# Patient Record
Sex: Male | Born: 1970 | Race: White | Hispanic: No | Marital: Married | Smoking: Current every day smoker
Health system: Southern US, Community
[De-identification: ages and names within clinical notes are randomized; demographics above are authoritative.]

## PROBLEM LIST (undated history)

## (undated) DIAGNOSIS — F419 Anxiety disorder, unspecified: Secondary | ICD-10-CM

## (undated) DIAGNOSIS — E119 Type 2 diabetes mellitus without complications: Secondary | ICD-10-CM

## (undated) DIAGNOSIS — L299 Pruritus, unspecified: Secondary | ICD-10-CM

## (undated) DIAGNOSIS — I2510 Atherosclerotic heart disease of native coronary artery without angina pectoris: Secondary | ICD-10-CM

## (undated) HISTORY — DX: Pruritus, unspecified: L29.9

## (undated) HISTORY — DX: Anxiety disorder, unspecified: F41.9

---

## 2016-04-10 HISTORY — PX: CORONARY ARTERY BYPASS GRAFT: SHX141

## 2016-08-09 DIAGNOSIS — I2510 Atherosclerotic heart disease of native coronary artery without angina pectoris: Secondary | ICD-10-CM | POA: Diagnosis present

## 2016-08-12 DIAGNOSIS — E669 Obesity, unspecified: Secondary | ICD-10-CM | POA: Insufficient documentation

## 2016-08-12 DIAGNOSIS — IMO0002 Reserved for concepts with insufficient information to code with codable children: Secondary | ICD-10-CM

## 2016-08-12 DIAGNOSIS — Z91199 Patient's noncompliance with other medical treatment and regimen due to unspecified reason: Secondary | ICD-10-CM | POA: Insufficient documentation

## 2016-08-16 DIAGNOSIS — Z951 Presence of aortocoronary bypass graft: Secondary | ICD-10-CM | POA: Insufficient documentation

## 2016-08-22 DIAGNOSIS — I10 Essential (primary) hypertension: Secondary | ICD-10-CM | POA: Insufficient documentation

## 2016-08-22 DIAGNOSIS — K56609 Unspecified intestinal obstruction, unspecified as to partial versus complete obstruction: Secondary | ICD-10-CM | POA: Insufficient documentation

## 2016-08-23 DIAGNOSIS — I255 Ischemic cardiomyopathy: Secondary | ICD-10-CM | POA: Insufficient documentation

## 2016-08-23 DIAGNOSIS — R778 Other specified abnormalities of plasma proteins: Secondary | ICD-10-CM | POA: Insufficient documentation

## 2016-08-25 DIAGNOSIS — R739 Hyperglycemia, unspecified: Secondary | ICD-10-CM | POA: Insufficient documentation

## 2016-08-25 DIAGNOSIS — D7589 Other specified diseases of blood and blood-forming organs: Secondary | ICD-10-CM | POA: Insufficient documentation

## 2016-08-28 DIAGNOSIS — E43 Unspecified severe protein-calorie malnutrition: Secondary | ICD-10-CM | POA: Insufficient documentation

## 2019-01-09 ENCOUNTER — Other Ambulatory Visit: Payer: Self-pay

## 2019-01-09 DIAGNOSIS — Z20822 Contact with and (suspected) exposure to covid-19: Secondary | ICD-10-CM

## 2019-01-11 LAB — NOVEL CORONAVIRUS, NAA: SARS-CoV-2, NAA: NOT DETECTED

## 2019-11-12 DIAGNOSIS — R0789 Other chest pain: Secondary | ICD-10-CM | POA: Insufficient documentation

## 2020-02-02 DIAGNOSIS — M4802 Spinal stenosis, cervical region: Secondary | ICD-10-CM | POA: Insufficient documentation

## 2020-02-02 DIAGNOSIS — Z6828 Body mass index (BMI) 28.0-28.9, adult: Secondary | ICD-10-CM | POA: Insufficient documentation

## 2020-02-23 DIAGNOSIS — G5620 Lesion of ulnar nerve, unspecified upper limb: Secondary | ICD-10-CM | POA: Insufficient documentation

## 2021-04-15 ENCOUNTER — Emergency Department (HOSPITAL_COMMUNITY): Payer: Commercial Managed Care - PPO

## 2021-04-15 ENCOUNTER — Emergency Department (HOSPITAL_COMMUNITY)
Admission: EM | Admit: 2021-04-15 | Discharge: 2021-04-15 | Disposition: A | Discharge status: Home / Self Care | Payer: Commercial Managed Care - PPO | Source: Home / Self Care

## 2021-04-15 ENCOUNTER — Other Ambulatory Visit: Payer: Self-pay

## 2021-04-15 ENCOUNTER — Encounter (HOSPITAL_COMMUNITY): Payer: Self-pay | Admitting: Emergency Medicine

## 2021-04-15 DIAGNOSIS — Z5321 Procedure and treatment not carried out due to patient leaving prior to being seen by health care provider: Secondary | ICD-10-CM | POA: Insufficient documentation

## 2021-04-15 DIAGNOSIS — I6350 Cerebral infarction due to unspecified occlusion or stenosis of unspecified cerebral artery: Secondary | ICD-10-CM | POA: Diagnosis not present

## 2021-04-15 DIAGNOSIS — Y900 Blood alcohol level of less than 20 mg/100 ml: Secondary | ICD-10-CM | POA: Insufficient documentation

## 2021-04-15 DIAGNOSIS — I639 Cerebral infarction, unspecified: Secondary | ICD-10-CM | POA: Insufficient documentation

## 2021-04-15 DIAGNOSIS — H538 Other visual disturbances: Secondary | ICD-10-CM | POA: Insufficient documentation

## 2021-04-15 DIAGNOSIS — H5461 Unqualified visual loss, right eye, normal vision left eye: Secondary | ICD-10-CM | POA: Diagnosis not present

## 2021-04-15 HISTORY — DX: Atherosclerotic heart disease of native coronary artery without angina pectoris: I25.10

## 2021-04-15 HISTORY — DX: Type 2 diabetes mellitus without complications: E11.9

## 2021-04-15 LAB — CBC
HCT: 51.4 % (ref 39.0–52.0)
Hemoglobin: 17.3 g/dL — ABNORMAL HIGH (ref 13.0–17.0)
MCH: 33 pg (ref 26.0–34.0)
MCHC: 33.7 g/dL (ref 30.0–36.0)
MCV: 97.9 fL (ref 80.0–100.0)
Platelets: 195 10*3/uL (ref 150–400)
RBC: 5.25 MIL/uL (ref 4.22–5.81)
RDW: 12.3 % (ref 11.5–15.5)
WBC: 10.3 10*3/uL (ref 4.0–10.5)
nRBC: 0 % (ref 0.0–0.2)

## 2021-04-15 LAB — COMPREHENSIVE METABOLIC PANEL
ALT: 30 U/L (ref 0–44)
AST: 32 U/L (ref 15–41)
Albumin: 4.1 g/dL (ref 3.5–5.0)
Alkaline Phosphatase: 96 U/L (ref 38–126)
Anion gap: 8 (ref 5–15)
BUN: 10 mg/dL (ref 6–20)
CO2: 25 mmol/L (ref 22–32)
Calcium: 9.3 mg/dL (ref 8.9–10.3)
Chloride: 103 mmol/L (ref 98–111)
Creatinine, Ser: 0.76 mg/dL (ref 0.61–1.24)
GFR, Estimated: >60 mL/min (ref >60)
Glucose, Bld: 119 mg/dL — ABNORMAL HIGH (ref 70–99)
Potassium: 4.6 mmol/L (ref 3.5–5.1)
Sodium: 136 mmol/L (ref 135–145)
Total Bilirubin: 0.9 mg/dL (ref 0.3–1.2)
Total Protein: 7.4 g/dL (ref 6.5–8.1)

## 2021-04-15 LAB — I-STAT CHEM 8, ED
BUN: 12 mg/dL (ref 6–20)
Calcium, Ion: 1.19 mmol/L (ref 1.15–1.40)
Chloride: 105 mmol/L (ref 98–111)
Creatinine, Ser: 0.7 mg/dL (ref 0.61–1.24)
Glucose, Bld: 114 mg/dL — ABNORMAL HIGH (ref 70–99)
HCT: 52 % (ref 39.0–52.0)
Hemoglobin: 17.7 g/dL — ABNORMAL HIGH (ref 13.0–17.0)
Potassium: 4.4 mmol/L (ref 3.5–5.1)
Sodium: 139 mmol/L (ref 135–145)
TCO2: 27 mmol/L (ref 22–32)

## 2021-04-15 LAB — DIFFERENTIAL
Abs Immature Granulocytes: 0.03 10*3/uL (ref 0.00–0.07)
Basophils Absolute: 0 10*3/uL (ref 0.0–0.1)
Basophils Relative: 0 %
Eosinophils Absolute: 0.1 10*3/uL (ref 0.0–0.5)
Eosinophils Relative: 1 %
Immature Granulocytes: 0 %
Lymphocytes Relative: 33 %
Lymphs Abs: 3.4 10*3/uL (ref 0.7–4.0)
Monocytes Absolute: 0.9 10*3/uL (ref 0.1–1.0)
Monocytes Relative: 9 %
Neutro Abs: 5.8 10*3/uL (ref 1.7–7.7)
Neutrophils Relative %: 57 %

## 2021-04-15 LAB — ETHANOL: Alcohol, Ethyl (B): <10 mg/dL (ref <10)

## 2021-04-15 LAB — APTT: aPTT: 31 seconds (ref 24–36)

## 2021-04-15 LAB — PROTIME-INR
INR: 1 (ref 0.8–1.2)
Prothrombin Time: 12.7 seconds (ref 11.4–15.2)

## 2021-04-15 IMAGING — CT CT HEAD W/O CM
4 series · 16 of 47 positions shown, 18 images · non-contrast
Comparison: None.

CLINICAL DATA: Neuro deficit, acute stroke suspected. Reported to
be unable to see out of his left eye upon awakening today.

EXAM:
CT HEAD WITHOUT CONTRAST
TECHNIQUE: Contiguous axial images were obtained from the base of the skull
through the vertex without intravenous contrast.

[Series 3: head without · axial · non-contrast · 0.46mm/px · z∈[-123,+7]mm · 7 of 36 slices shown, 9 images]
[im 5/36  brain]
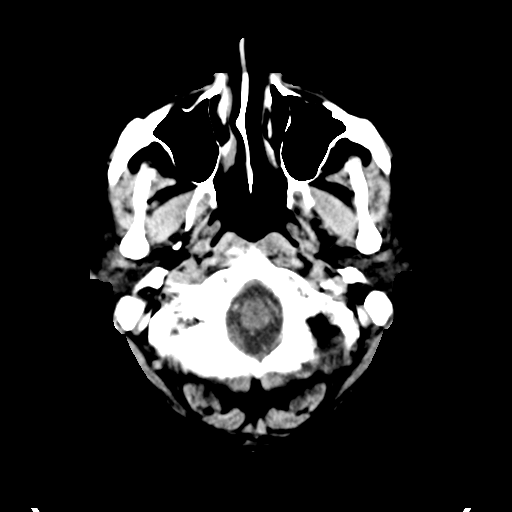
[im 5/36  bone]
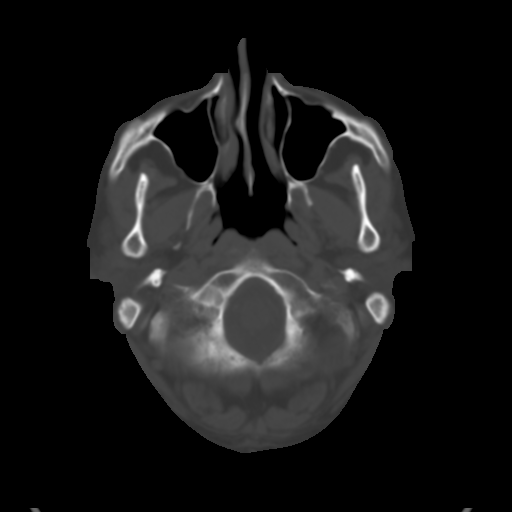
[im 9/36  brain]
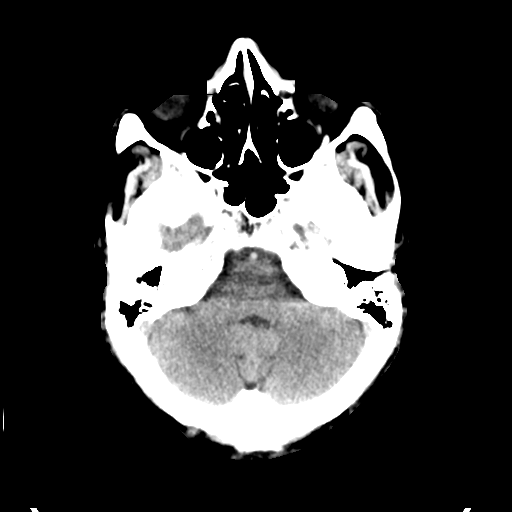
[im 14/36  brain]
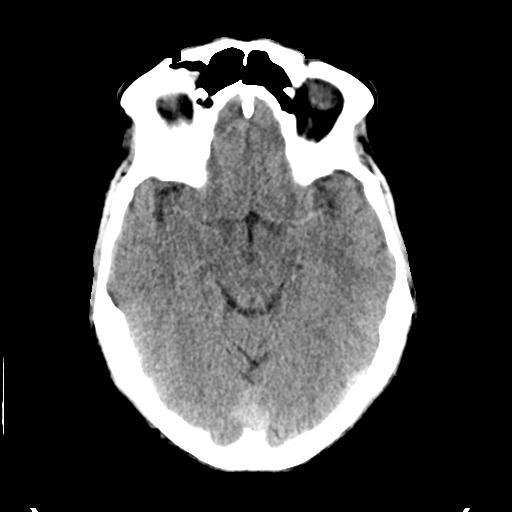
[im 18/36  brain]
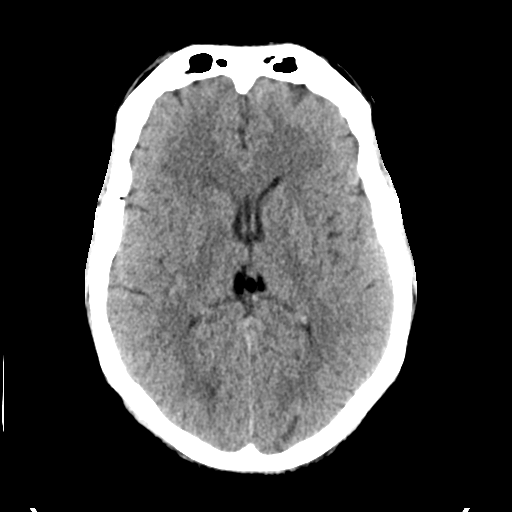
[im 22/36  brain]
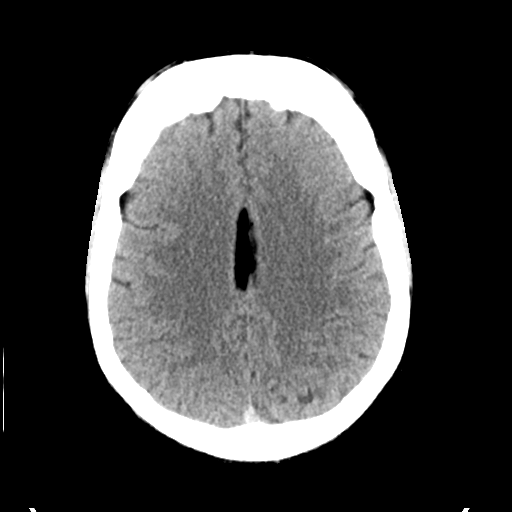
[im 22/36  bone]
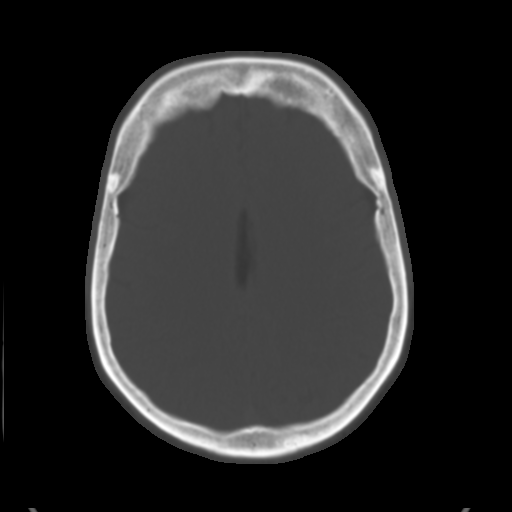
[im 27/36  brain]
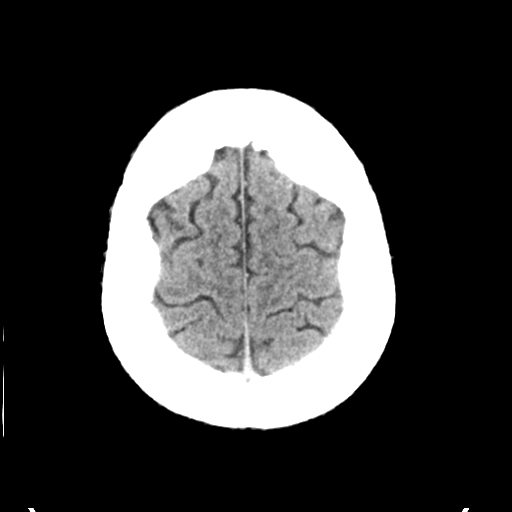
[im 31/36  brain]
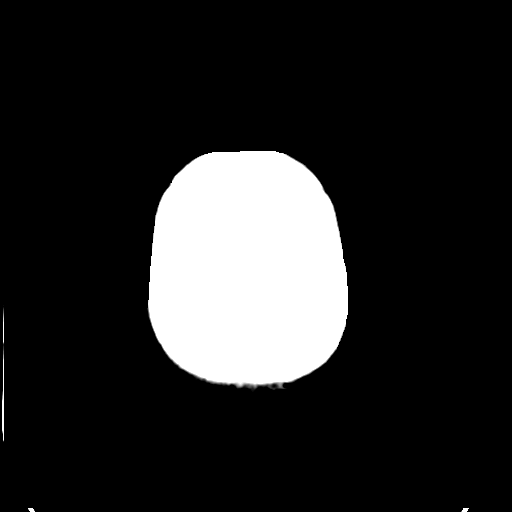

[Series 4: head bone · axial · 0.46mm/px · z∈[-127,-91]mm · 3 of 88 slices shown]
[im 9/88  bone]
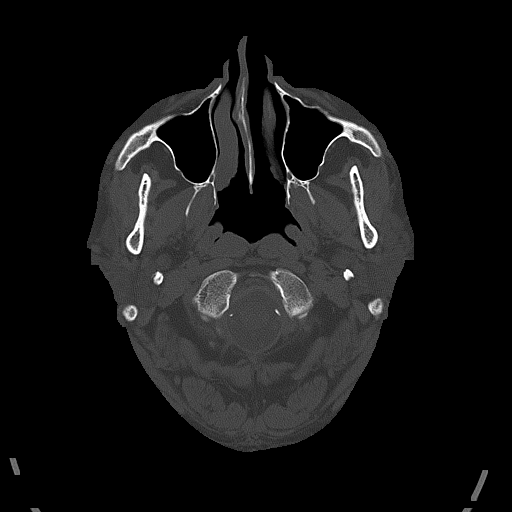
[im 18/88  bone]
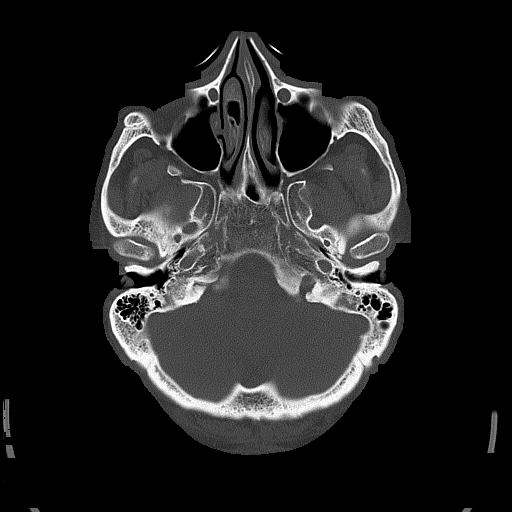
[im 27/88  bone]
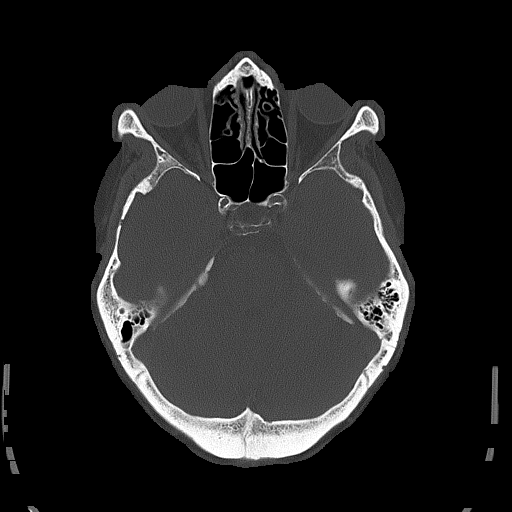

[Series 5: head without cor · coronal · non-contrast · 0.38mm/px · 3 of 76 slices shown]
[im 26/76  brain]
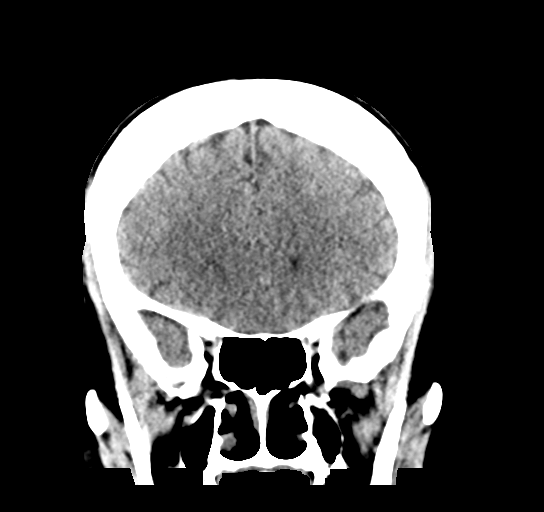
[im 34/76  brain]
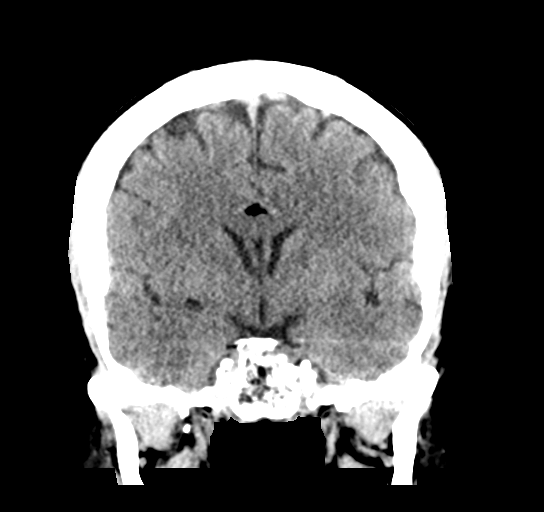
[im 42/76  brain]
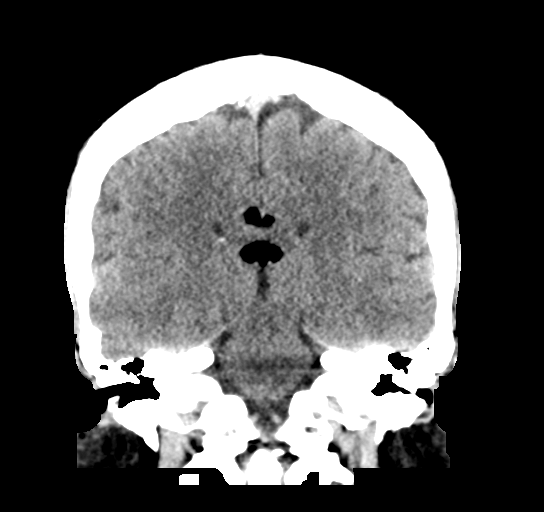

[Series 6: head without sag · sagittal · non-contrast · 0.40mm/px · 3 of 67 slices shown]
[im 23/67  brain]
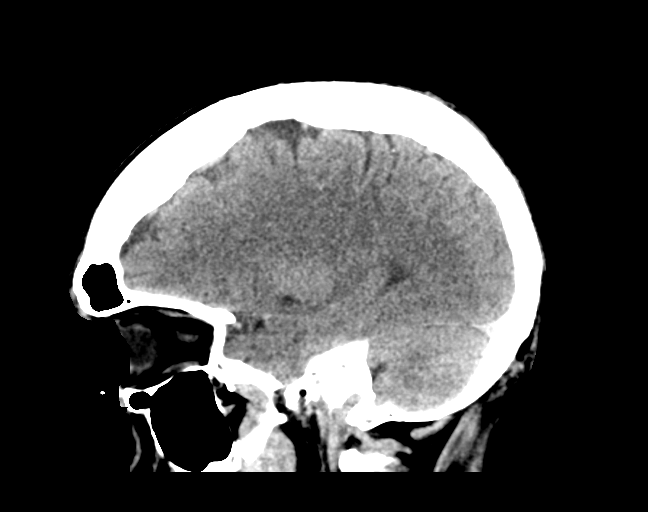
[im 34/67  brain]
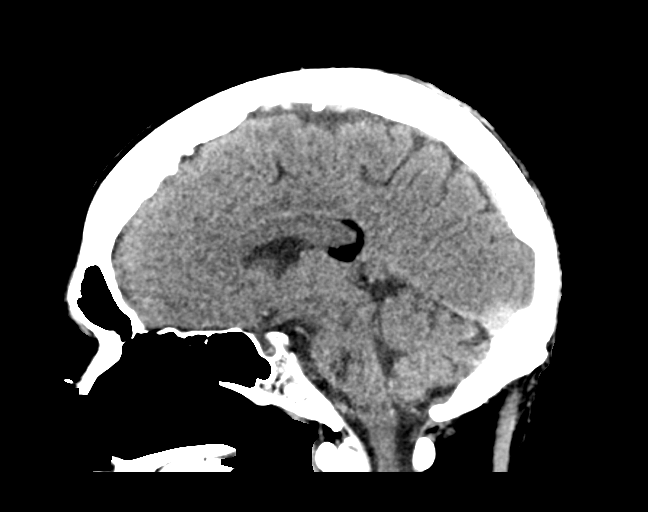
[im 45/67  brain]
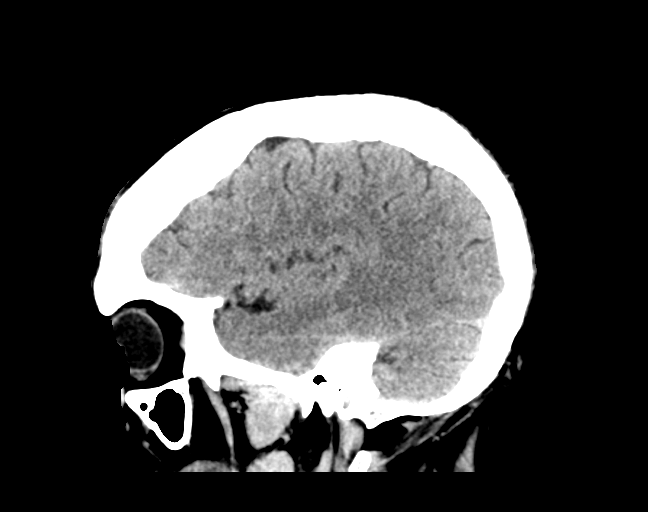

[16 of 47 positions shown; findings below may reference images not displayed]

FINDINGS: Brain: There is no evidence of acute intracranial hemorrhage, mass
lesion, brain edema or extra-axial fluid collection. The ventricles
and subarachnoid spaces are appropriately sized for age. There is no
CT evidence of acute cortical infarction. There is a large
curvilinear pericallosal lipoma, measuring up to 5.4 cm in length on
image [DATE].

Vascular: Intracranial vascular calcifications. No hyperdense vessel
identified.

Skull: Negative for fracture or focal lesion.

Sinuses/Orbits: Probable small mucous retention cyst inferiorly in
the left maxillary sinus. The additional visualized paranasal
sinuses, mastoid air cells and middle ears are clear.

Other: None.
IMPRESSION: 1. No acute intracranial findings demonstrated. No CT evidence of
acute stroke or hemorrhage.
2. Incidental large curvilinear pericallosal lipoma.

## 2021-04-15 NOTE — ED Triage Notes (Addendum)
Pt. Stated, when I woke up 0400 , my vision was not right , it was blurry. I put my hand over left eye the right eye was nothing black. I went to a eye Dr. In [place] sent me to a Dr. In [place] and the Dr. Here sent me to ER. The Dr. In [place] said he a plague on his eye. Everything was normal when he went to sleep No arm drift, symmetrical smile, Alert and oriented x 4.

## 2021-04-15 NOTE — ED Notes (Signed)
Patient not yet roomed from triage.

## 2021-04-15 NOTE — ED Provider Triage Note (Signed)
Emergency Medicine Provider Triage Evaluation Note  Martin Lee , a 51 y.o. male  was evaluated in triage.  Pt complains of right-sided vision loss.  She states that he went to bed around 6 PM last night with normal vision and woke up this morning with right-sided vision loss.  He states he does not complete vision loss however he has had decreased vision on that side.  He denies headache, fevers, painful vision loss, painful extraocular movements, nausea or vomiting, head or eye trauma.  Denies numbness or tingling in his extremities or other neurological signs just weakness, facial droop, slurred speech.  Review of Systems  Positive: See above Negative:   Physical Exam  BP (!) 139/99    Pulse 96    Temp 97.9 F (36.6 C) (Oral)    Resp 16    SpO2 98%  Gen:   Awake, no distress   Resp:  Normal effort  MSK:   Moves extremities without difficulty  Other:  Peripheral field deficit on the right eye.  PERRLA.  Extraocular movements without pain.  Medical Decision Making  Medically screening exam initiated at 12:22 PM.  Appropriate orders placed.  Martin Lee was informed that the remainder of the evaluation will be completed by another provider, this initial triage assessment does not replace that evaluation, and the importance of remaining in the ED until their evaluation is complete.  Spoke with Dr. Warden Fillers who saw patient this morning. 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     Mickie Hillier, PA-C 04/15/21 1228

## 2021-04-15 NOTE — ED Notes (Signed)
Called loudly x3 and really loudly x1 in lobby. No response.

## 2021-04-15 NOTE — ED Provider Notes (Signed)
Spoke with Dr. Quinn Axe, neurology who agrees that patient needs to have continued stroke work-up.  She states that she would like him admitted through hospitalist and she will place further imaging orders at this time.  We will try and place patient in ER bed from waiting room so he can be evaluated by neurology and admitted.   Mickie Hillier, PA-C 04/15/21 1554    Horton, Alvin Critchley, DO 04/18/21 1454

## 2021-04-15 NOTE — ED Provider Notes (Signed)
Dr. Sallye Lat, ophthalmology came to ED to re-evaluate the patient and agrees with this providers visual assessment. Per MD, patient had complete vision loss in R eye this morning. He does have some return of vision to the right eye with peripheral field deficits. He appreciates neuro input on further imaging CTA vs MRA etc.    Cristopher Peru, PA-C 04/15/21 1325    Horton, Clabe Seal, DO 04/18/21 1454

## 2021-04-15 NOTE — ED Provider Notes (Signed)
Patient called from the lobby by nursing to be seen in her room to be evaluated by neurology.  He was called multiple times with no answer.  I have phoned him this his cell.  He states that he drove home back to [place] due to a family emergency.  I discussed that neurology wanted him to be evaluated and admitted to the hospital for further work-up of his visual changes.  Discussed concerns for possible stroke.  He verbalized understanding.  I discussed that he should return as soon as possible.  He verbalized understanding.   Cristopher Peru, PA-C 04/15/21 1642    Jacalyn Lefevre, MD 04/15/21 [place] Guardian

## 2021-04-16 ENCOUNTER — Encounter (HOSPITAL_COMMUNITY): Payer: Self-pay

## 2021-04-16 ENCOUNTER — Emergency Department (HOSPITAL_COMMUNITY): Payer: Commercial Managed Care - PPO

## 2021-04-16 ENCOUNTER — Inpatient Hospital Stay (HOSPITAL_COMMUNITY): Payer: Commercial Managed Care - PPO

## 2021-04-16 ENCOUNTER — Inpatient Hospital Stay (HOSPITAL_COMMUNITY)
Admission: EM | Admit: 2021-04-16 | Discharge: 2021-04-20 | DRG: 038 | Disposition: A | Discharge status: Home / Self Care | Payer: Commercial Managed Care - PPO | Source: Ambulatory Visit | Attending: Internal Medicine | Admitting: Internal Medicine

## 2021-04-16 ENCOUNTER — Observation Stay (HOSPITAL_COMMUNITY): Payer: Commercial Managed Care - PPO

## 2021-04-16 DIAGNOSIS — E1159 Type 2 diabetes mellitus with other circulatory complications: Secondary | ICD-10-CM | POA: Diagnosis not present

## 2021-04-16 DIAGNOSIS — D72829 Elevated white blood cell count, unspecified: Secondary | ICD-10-CM | POA: Diagnosis present

## 2021-04-16 DIAGNOSIS — Z20822 Contact with and (suspected) exposure to covid-19: Secondary | ICD-10-CM | POA: Diagnosis present

## 2021-04-16 DIAGNOSIS — I6389 Other cerebral infarction: Secondary | ICD-10-CM | POA: Diagnosis not present

## 2021-04-16 DIAGNOSIS — I10 Essential (primary) hypertension: Secondary | ICD-10-CM | POA: Diagnosis present

## 2021-04-16 DIAGNOSIS — I6350 Cerebral infarction due to unspecified occlusion or stenosis of unspecified cerebral artery: Secondary | ICD-10-CM | POA: Diagnosis present

## 2021-04-16 DIAGNOSIS — I63031 Cerebral infarction due to thrombosis of right carotid artery: Secondary | ICD-10-CM | POA: Diagnosis not present

## 2021-04-16 DIAGNOSIS — F32A Depression, unspecified: Secondary | ICD-10-CM | POA: Diagnosis present

## 2021-04-16 DIAGNOSIS — D751 Secondary polycythemia: Secondary | ICD-10-CM | POA: Diagnosis present

## 2021-04-16 DIAGNOSIS — E119 Type 2 diabetes mellitus without complications: Secondary | ICD-10-CM | POA: Diagnosis present

## 2021-04-16 DIAGNOSIS — H5461 Unqualified visual loss, right eye, normal vision left eye: Secondary | ICD-10-CM | POA: Diagnosis present

## 2021-04-16 DIAGNOSIS — I6521 Occlusion and stenosis of right carotid artery: Secondary | ICD-10-CM | POA: Diagnosis present

## 2021-04-16 DIAGNOSIS — I25700 Atherosclerosis of coronary artery bypass graft(s), unspecified, with unstable angina pectoris: Secondary | ICD-10-CM | POA: Diagnosis not present

## 2021-04-16 DIAGNOSIS — I2510 Atherosclerotic heart disease of native coronary artery without angina pectoris: Secondary | ICD-10-CM | POA: Diagnosis present

## 2021-04-16 DIAGNOSIS — I6310 Cerebral infarction due to embolism of unspecified precerebral artery: Secondary | ICD-10-CM

## 2021-04-16 DIAGNOSIS — Z720 Tobacco use: Secondary | ICD-10-CM | POA: Diagnosis present

## 2021-04-16 DIAGNOSIS — R29702 NIHSS score 2: Secondary | ICD-10-CM | POA: Diagnosis present

## 2021-04-16 DIAGNOSIS — H53131 Sudden visual loss, right eye: Secondary | ICD-10-CM

## 2021-04-16 DIAGNOSIS — I25810 Atherosclerosis of coronary artery bypass graft(s) without angina pectoris: Secondary | ICD-10-CM | POA: Diagnosis not present

## 2021-04-16 DIAGNOSIS — Z8673 Personal history of transient ischemic attack (TIA), and cerebral infarction without residual deficits: Secondary | ICD-10-CM

## 2021-04-16 DIAGNOSIS — E1149 Type 2 diabetes mellitus with other diabetic neurological complication: Secondary | ICD-10-CM

## 2021-04-16 DIAGNOSIS — IMO0002 Reserved for concepts with insufficient information to code with codable children: Secondary | ICD-10-CM

## 2021-04-16 DIAGNOSIS — Z951 Presence of aortocoronary bypass graft: Secondary | ICD-10-CM | POA: Diagnosis not present

## 2021-04-16 DIAGNOSIS — Z79899 Other long term (current) drug therapy: Secondary | ICD-10-CM

## 2021-04-16 DIAGNOSIS — I63231 Cerebral infarction due to unspecified occlusion or stenosis of right carotid arteries: Secondary | ICD-10-CM | POA: Diagnosis not present

## 2021-04-16 DIAGNOSIS — F17210 Nicotine dependence, cigarettes, uncomplicated: Secondary | ICD-10-CM | POA: Diagnosis present

## 2021-04-16 DIAGNOSIS — F419 Anxiety disorder, unspecified: Secondary | ICD-10-CM | POA: Diagnosis present

## 2021-04-16 DIAGNOSIS — I639 Cerebral infarction, unspecified: Secondary | ICD-10-CM | POA: Diagnosis present

## 2021-04-16 DIAGNOSIS — E1169 Type 2 diabetes mellitus with other specified complication: Secondary | ICD-10-CM

## 2021-04-16 DIAGNOSIS — I63239 Cerebral infarction due to unspecified occlusion or stenosis of unspecified carotid arteries: Secondary | ICD-10-CM | POA: Diagnosis not present

## 2021-04-16 DIAGNOSIS — Z7982 Long term (current) use of aspirin: Secondary | ICD-10-CM | POA: Diagnosis not present

## 2021-04-16 DIAGNOSIS — H3411 Central retinal artery occlusion, right eye: Secondary | ICD-10-CM | POA: Diagnosis present

## 2021-04-16 DIAGNOSIS — H539 Unspecified visual disturbance: Secondary | ICD-10-CM | POA: Diagnosis not present

## 2021-04-16 DIAGNOSIS — E782 Mixed hyperlipidemia: Secondary | ICD-10-CM | POA: Diagnosis present

## 2021-04-16 LAB — BASIC METABOLIC PANEL
Anion gap: 9 (ref 5–15)
BUN: 11 mg/dL (ref 6–20)
CO2: 23 mmol/L (ref 22–32)
Calcium: 9.5 mg/dL (ref 8.9–10.3)
Chloride: 106 mmol/L (ref 98–111)
Creatinine, Ser: 0.78 mg/dL (ref 0.61–1.24)
GFR, Estimated: >60 mL/min (ref >60)
Glucose, Bld: 139 mg/dL — ABNORMAL HIGH (ref 70–99)
Potassium: 5 mmol/L (ref 3.5–5.1)
Sodium: 138 mmol/L (ref 135–145)

## 2021-04-16 LAB — ECHOCARDIOGRAM COMPLETE BUBBLE STUDY
AR max vel: 1.74 cm2
AV Area VTI: 1.9 cm2
AV Area mean vel: 1.66 cm2
AV Mean grad: 3 mmHg
AV Peak grad: 5.5 mmHg
Ao pk vel: 1.17 m/s
Area-P 1/2: 4.8 cm2
S' Lateral: 4 cm

## 2021-04-16 LAB — CBC
HCT: 51 % (ref 39.0–52.0)
Hemoglobin: 17.8 g/dL — ABNORMAL HIGH (ref 13.0–17.0)
MCH: 33.6 pg (ref 26.0–34.0)
MCHC: 34.9 g/dL (ref 30.0–36.0)
MCV: 96.2 fL (ref 80.0–100.0)
Platelets: 194 10*3/uL (ref 150–400)
RBC: 5.3 MIL/uL (ref 4.22–5.81)
RDW: 12.3 % (ref 11.5–15.5)
WBC: 10.6 10*3/uL — ABNORMAL HIGH (ref 4.0–10.5)
nRBC: 0.2 % (ref 0.0–0.2)

## 2021-04-16 LAB — RESP PANEL BY RT-PCR (FLU A&B, COVID) ARPGX2
Influenza A by PCR: NEGATIVE
Influenza B by PCR: NEGATIVE
SARS Coronavirus 2 by RT PCR: NEGATIVE

## 2021-04-16 LAB — HEMOGLOBIN A1C
Hgb A1c MFr Bld: 7.4 % — ABNORMAL HIGH (ref 4.8–5.6)
Mean Plasma Glucose: 165.68 mg/dL

## 2021-04-16 LAB — LDL CHOLESTEROL, DIRECT: Direct LDL: 76.5 mg/dL (ref 0–99)

## 2021-04-16 IMAGING — CT CT ANGIO HEAD-NECK (W OR W/O PERF)
1 of 11 series · 5 of 33 positions shown · IV contrast (APPLIED)
Comparison: Brain MRI/MRA [DATE]

CLINICAL DATA: Acute neurologic deficit.

EXAM:
CT ANGIOGRAPHY HEAD AND NECK
TECHNIQUE: Multidetector CT imaging of the head and neck was performed using
the standard protocol during bolus administration of intravenous
contrast. Multiplanar CT image reconstructions and MIPs were
obtained to evaluate the vascular anatomy. Carotid stenosis
measurements (when applicable) are obtained utilizing NASCET
criteria, using the distal internal carotid diameter as the
denominator.
CONTRAST:  75mL OMNIPAQUE IOHEXOL 350 MG/ML SOLN

[Series 11: ax thins · axial · 0.46mm/px · z∈[-282,-36]mm · 5 of 371 slices shown]
[im 62/371  soft-tissue]
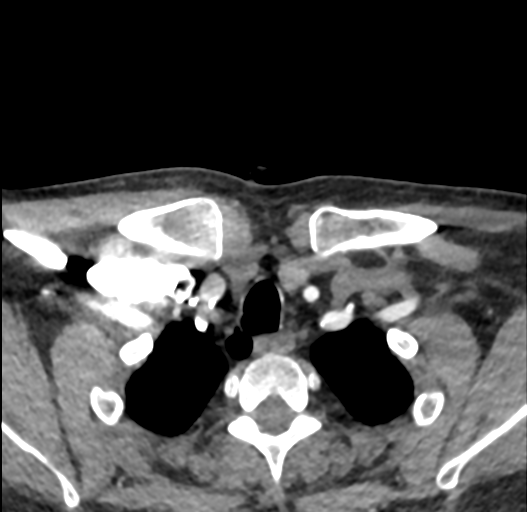
[im 124/371  bone]
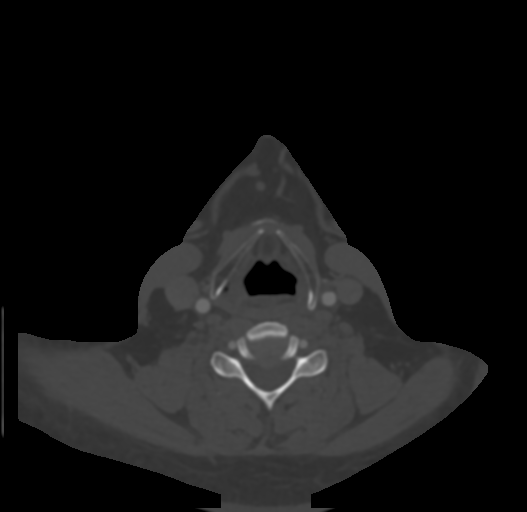
[im 186/371  soft-tissue]
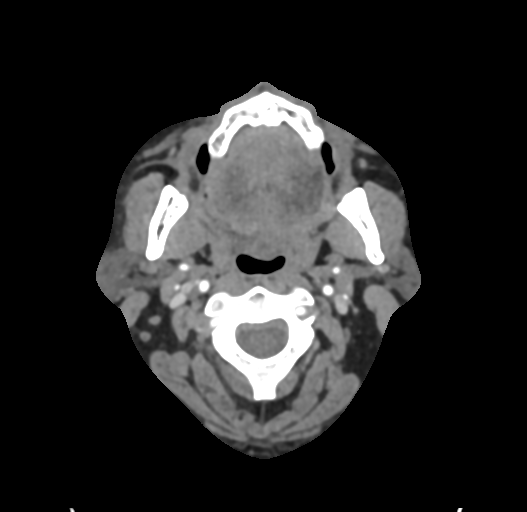
[im 247/371  bone]
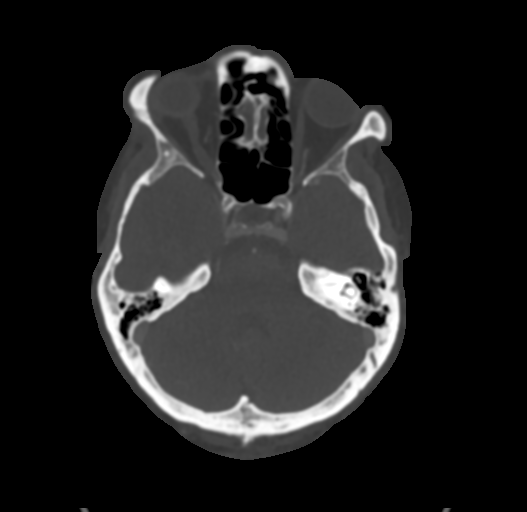
[im 309/371  soft-tissue]
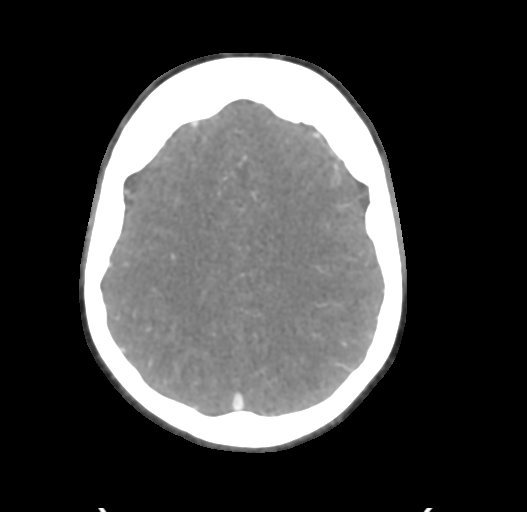

[5 of 33 positions shown; findings below may reference images not displayed]

FINDINGS: CT HEAD FINDINGS

Brain: No hemorrhage or extra-axial collection. Pericallosal lipoma
again noted. The size and configuration of the ventricles and
extra-axial CSF spaces are normal. There is no acute or chronic
infarction. The brain parenchyma is normal.

Skull: The visualized skull base, calvarium and extracranial soft
tissues are normal.

Sinuses/Orbits: No fluid levels or advanced mucosal thickening of
the visualized paranasal sinuses. No mastoid or middle ear effusion.
The orbits are normal.

CTA NECK FINDINGS

SKELETON: There is no bony spinal canal stenosis. No lytic or
blastic lesion.

OTHER NECK: Normal pharynx, larynx and major salivary glands. No
cervical lymphadenopathy. Unremarkable thyroid gland.

UPPER CHEST: No pneumothorax or pleural effusion. No nodules or
masses.

AORTIC ARCH:

There is calcific atherosclerosis of the aortic arch. There is no
aneurysm, dissection or hemodynamically significant stenosis of the
visualized portion of the aorta. Conventional 3 vessel aortic
branching pattern. The visualized proximal subclavian arteries are
widely patent.

RIGHT CAROTID SYSTEM: No dissection, occlusion or aneurysm. There is
predominantly low density atherosclerosis extending into the
proximal ICA, resulting in 50% stenosis.

LEFT CAROTID SYSTEM: No dissection, occlusion or aneurysm. There is
predominantly low density atherosclerosis extending into the
proximal ICA, resulting in less than 50% stenosis.

VERTEBRAL ARTERIES: Codominant configuration. Both origins are
clearly patent. There is no dissection, occlusion or flow-limiting
stenosis to the skull base (V1-V3 segments).

CTA HEAD FINDINGS

POSTERIOR CIRCULATION:

--Vertebral arteries: Bilateral atherosclerotic calcification with
moderate stenosis, but both remain patent.

--Inferior cerebellar arteries: Normal.

--Basilar artery: Normal.

--Superior cerebellar arteries: Normal.

--Posterior cerebral arteries (PCA): Normal.

ANTERIOR CIRCULATION:

--Intracranial internal carotid arteries: Normal.

--Anterior cerebral arteries (ACA): Normal. Both A1 segments are
present. Patent anterior communicating artery (a-comm).

--Middle cerebral arteries (MCA): Normal.

VENOUS SINUSES: As permitted by contrast timing, patent.

ANATOMIC VARIANTS: None

Review of the MIP images confirms the above findings.
IMPRESSION: 1. No emergent large vessel occlusion.
2. Bilateral proximal ICA plaque causing 50% stenosis on the right
and no hemodynamically significant stenosis on the left.
3. Moderate bilateral vertebral artery V4 segment atherosclerotic
calcification with moderate stenosis, but maintained patency.
4. Pericallosal lipoma, unchanged.

Aortic atherosclerosis ([Z4]-[Z4]).

## 2021-04-16 IMAGING — MR MR HEAD W/O CM
5 of 10 series · 23 of 48 positions shown · non-contrast
Comparison: Same day CT head.

CLINICAL DATA: Neuro deficit, acute, stroke suspected

EXAM:
MRI HEAD WITHOUT CONTRAST
TECHNIQUE: Multiplanar, multiecho pulse sequences of the brain and surrounding
structures were obtained without intravenous contrast.

[Series 2: DWI · axial · 3.0mm · 0.94mm/px · z∈[-73,+79]mm · 9 of 104 slices shown (1 of 2)]
[im 1/104]
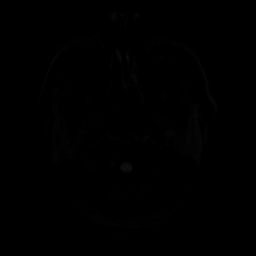
[im 13/104]
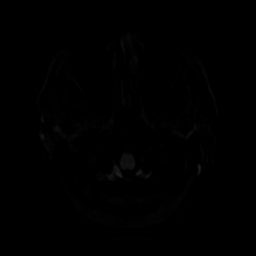
[im 26/104]
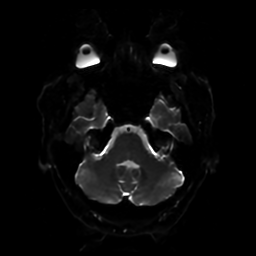
[im 39/104]
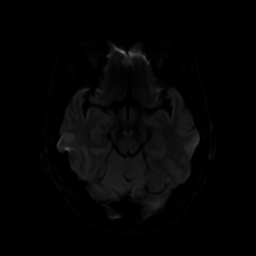
[im 52/104]
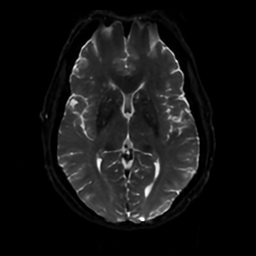
[im 65/104]
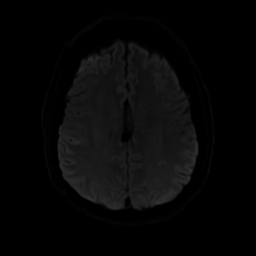
[im 78/104]
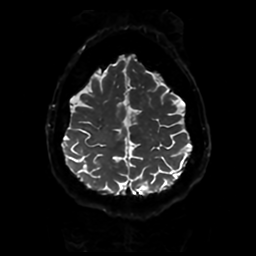
[im 91/104]
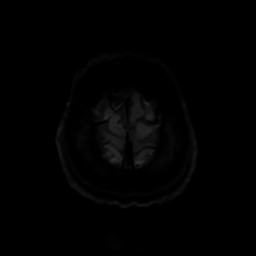
[im 104/104]
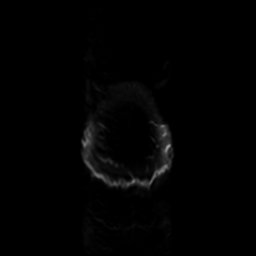

[Series 3: DWI · coronal · 4.0mm · 0.94mm/px · 7 of 76 slices shown (2 of 2)]
[im 1/76]
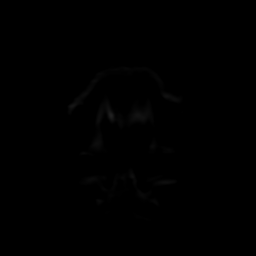
[im 13/76]
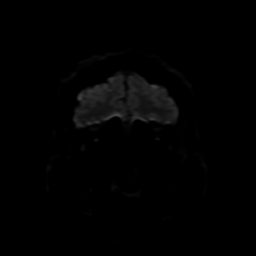
[im 26/76]
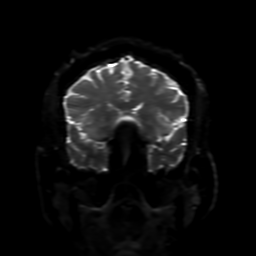
[im 38/76]
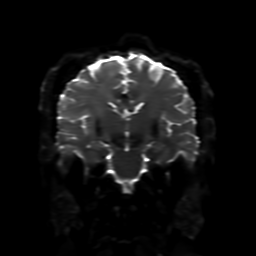
[im 51/76]
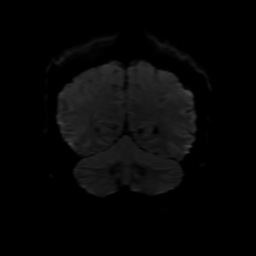
[im 63/76]
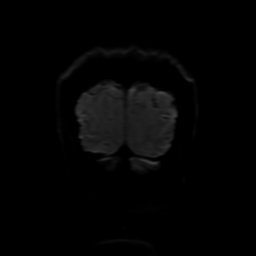
[im 76/76]
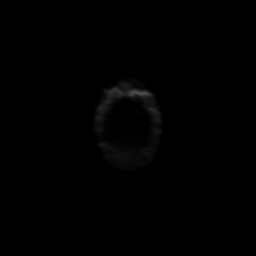

[Series 4: FLAIR · coronal · 5.0mm · 0.23mm/px · 3 of 33 slices shown (1 of 2)]
[im 1/33]
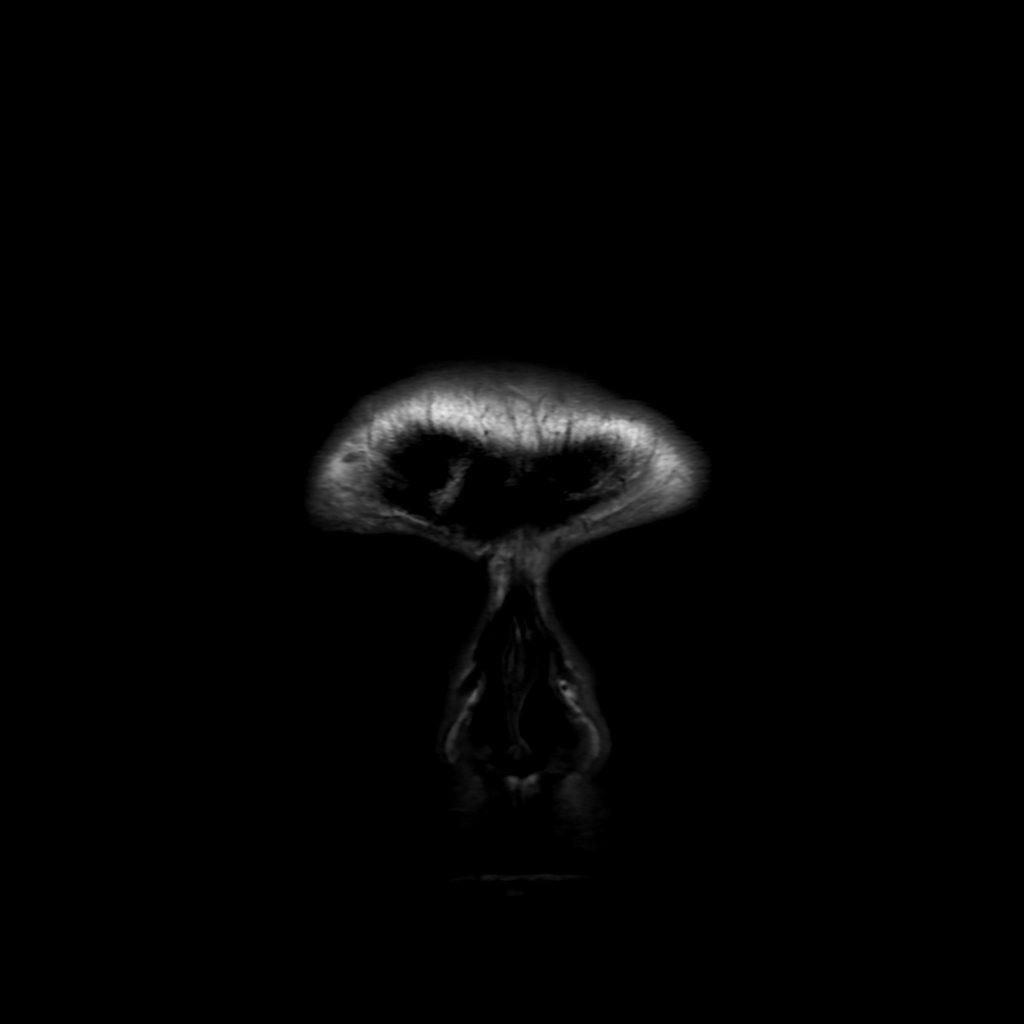
[im 17/33]
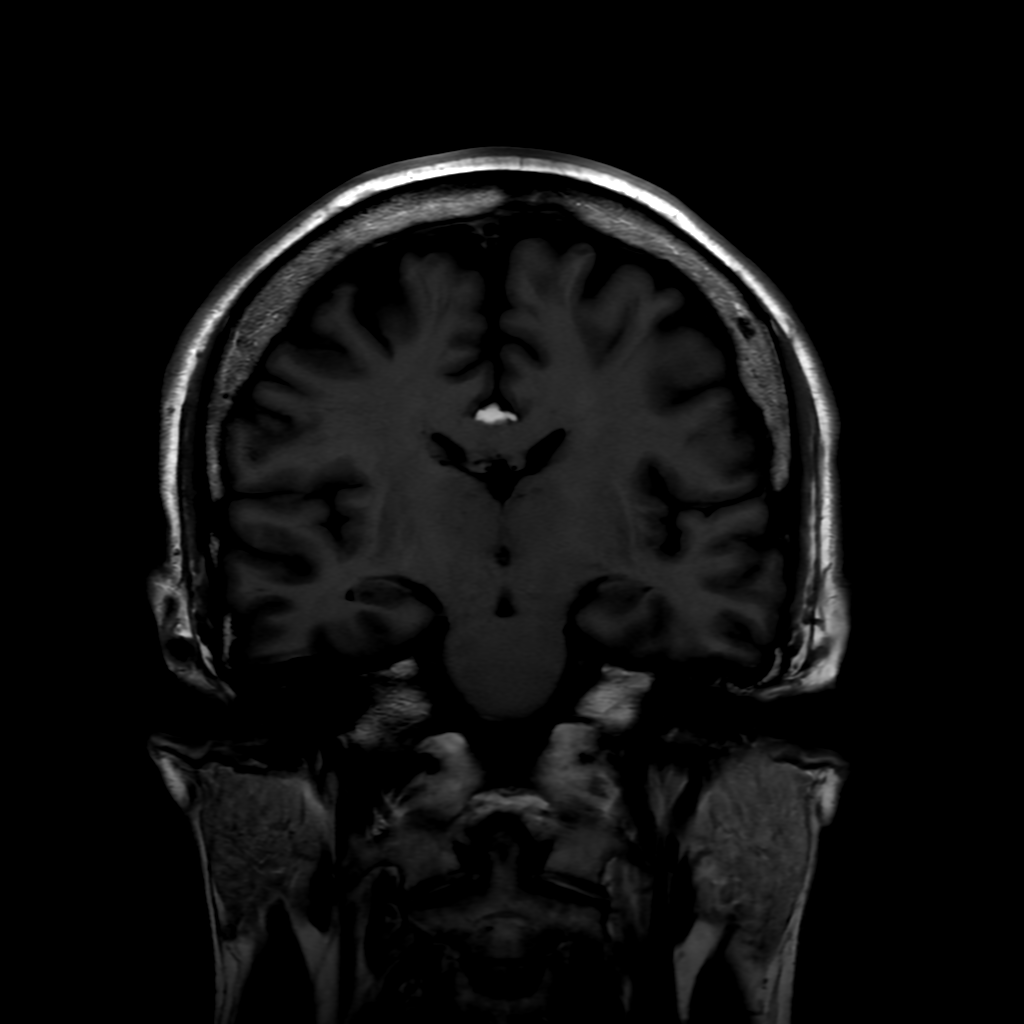
[im 33/33]
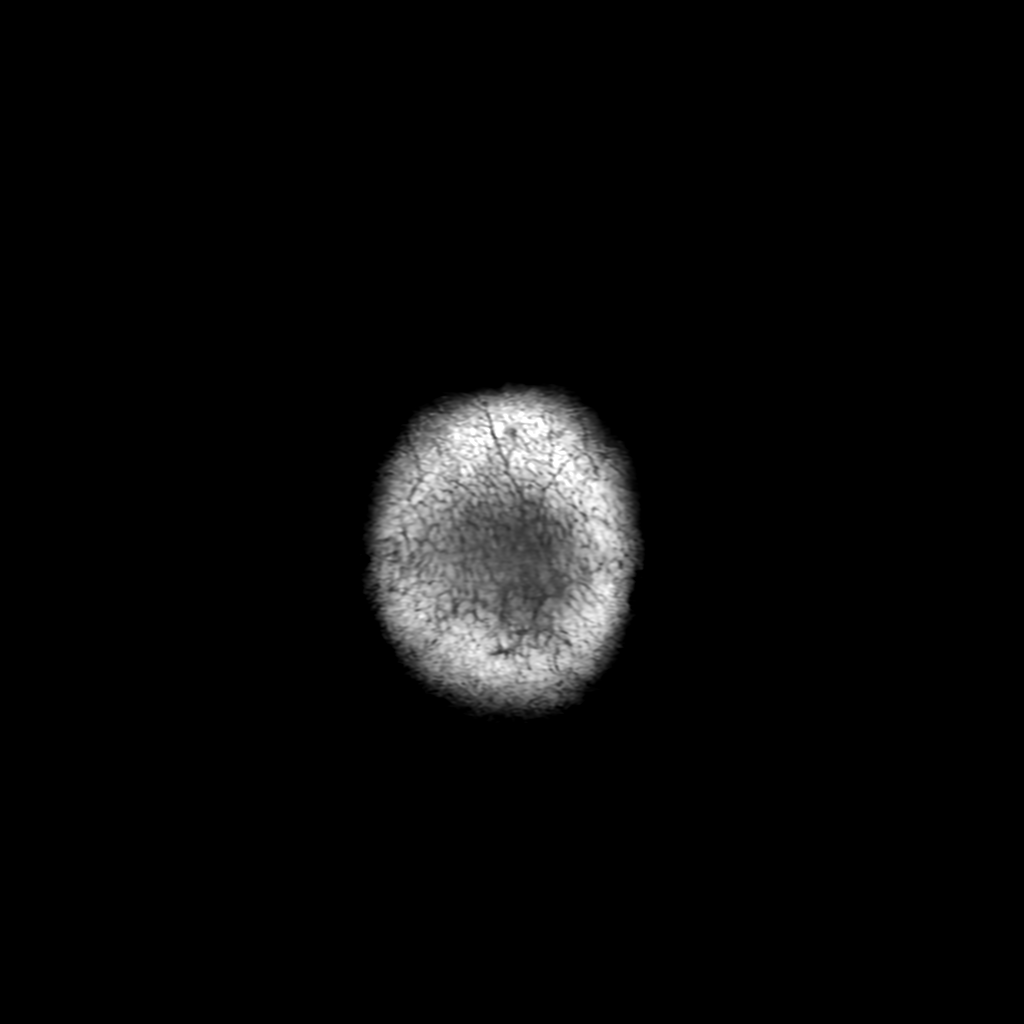

[Series 6: FLAIR · axial · 4.0mm · 0.45mm/px · z∈[-74,+75]mm · 3 of 35 slices shown (2 of 2)]
[im 1/35]
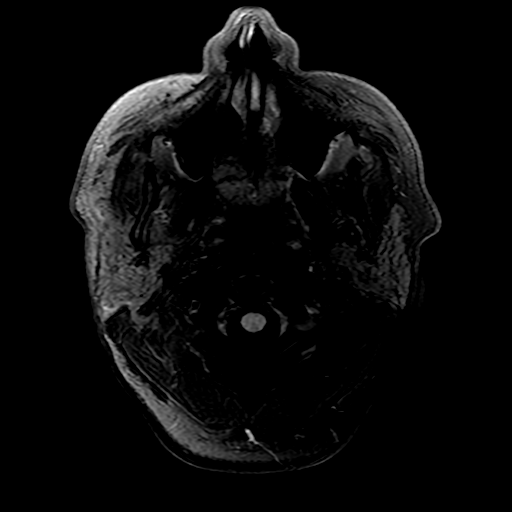
[im 18/35]
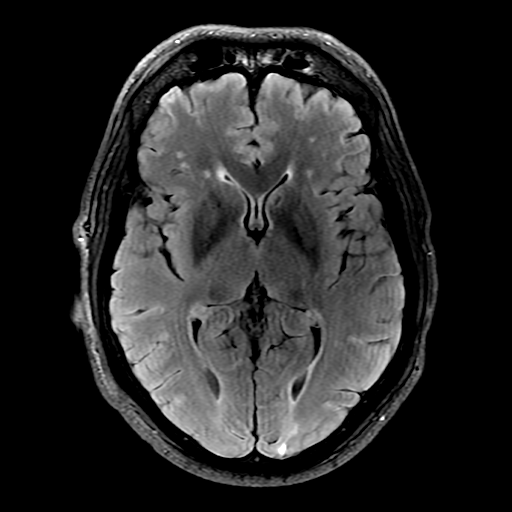
[im 35/35]
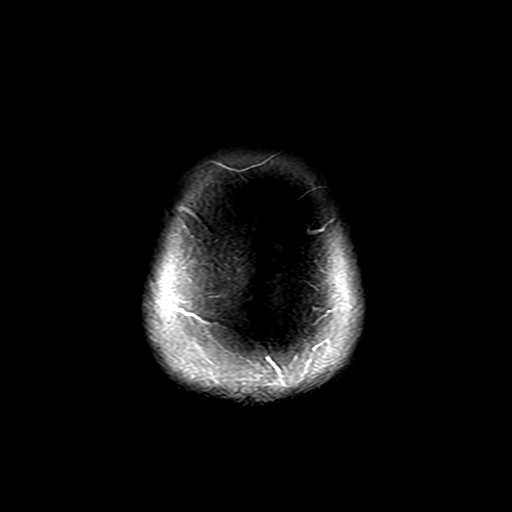

[Series 250: ADC · axial · 3.0mm · 0.94mm/px · 1 of 52 slices shown]
[im 1/52]
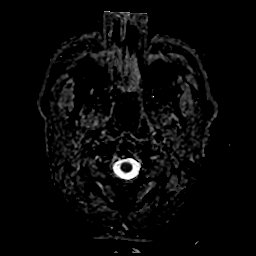

[23 of 48 positions shown; findings below may reference images not displayed]

FINDINGS: Brain: Multiple small foci of restricted diffusion in the high right
frontal cortex, suspicious for acute infarcts. Small areas of
cortical T2 hyperintensity without restricted diffusion in the left
parietal cortex, compatible with prior infarcts. Remote lacunar
infarct in the left caudate. Scattered small T2/FLAIR
hyperintensities in the white matter, nonspecific but compatible
with mild chronic microvascular ischemic disease. Discrete T2
hyperintensities in the right greater than left basal ganglia are
compatible with benign dilated perivascular spaces. No evidence of
acute hemorrhage, hydrocephalus, mass lesion, midline shift or
extra-axial fluid collection.

Pericallosal lipoma, as seen on same day CT head. Cavum septum
pellucidum et vergae, anatomic variant.

Vascular: See forthcoming MRA.

Skull and upper cervical spine: Normal marrow signal.

Sinuses/Orbits: Mild paranasal sinus mucosal thickening.
Unremarkable orbits.

Other: Trace mastoid effusions.
IMPRESSION: 1. Multiple small foci of restricted diffusion in the high right
frontal cortex, suspicious for acute infarcts. Mild associated edema
without mass effect.
2. Small remote left parietal cortical infarcts and remote left
caudate lacunar infarct.
3. Mild chronic microvascular ischemic disease.
4. Pericallosal lipoma.

## 2021-04-16 IMAGING — MR MR MRA HEAD W/O CM
3 series · 18 of 48 positions shown · non-contrast
Comparison: None.

CLINICAL DATA: Neuro deficit, acute, stroke suspected R vision
loss; Neuro deficit, acute, stroke suspected

EXAM:
MRA NECK WITHOUT CONTRAST
MRA HEAD WITHOUT CONTRAST
TECHNIQUE: Angiographic images of the Circle of Willis were acquired using MRA
technique without intravenous contrast.

[Series 2: ax (id) · axial · 1.0mm · 0.43mm/px · z∈[-52,+37]mm · 14 of 187 slices shown]
[im 1/187]
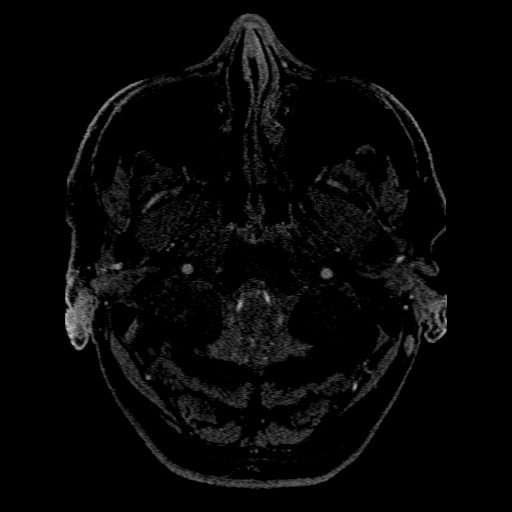
[im 5/187]
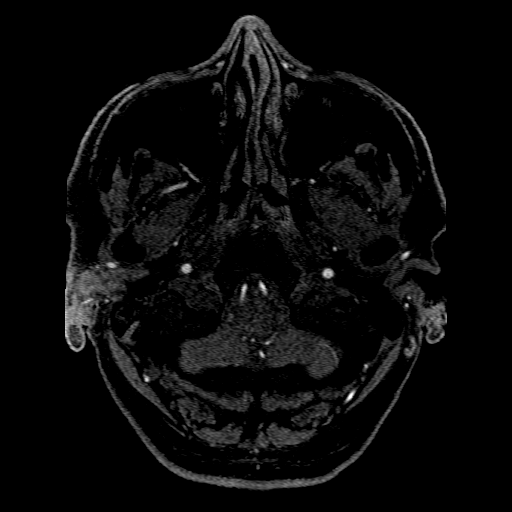
[im 9/187]
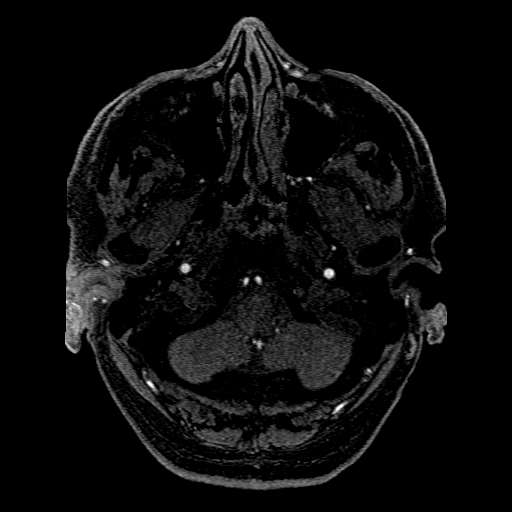
[im 13/187]
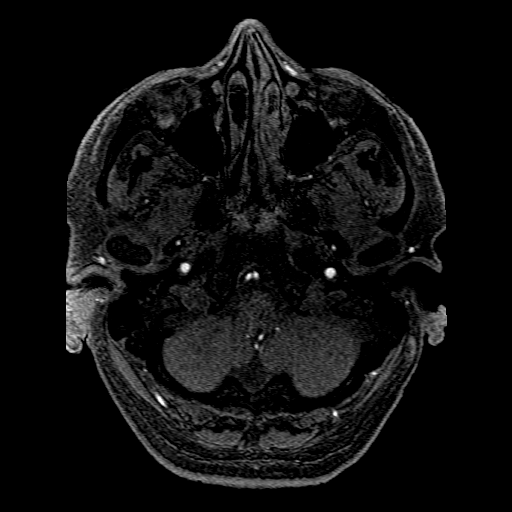
[im 31/187]
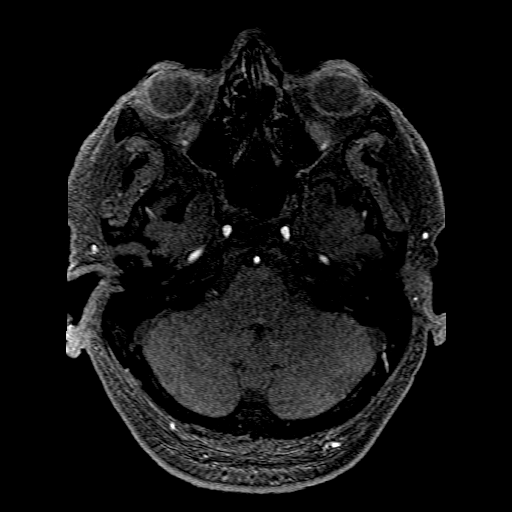
[im 35/187]
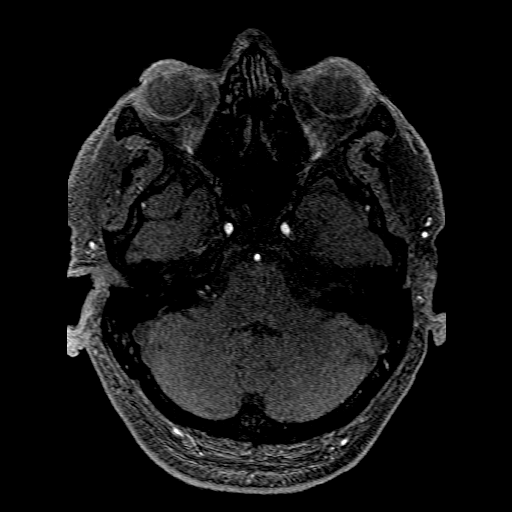
[im 57/187]
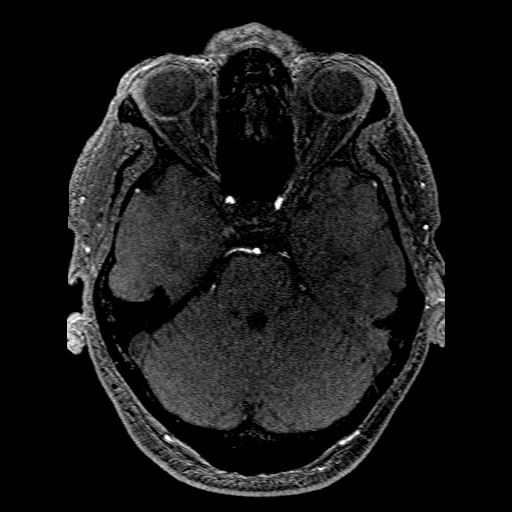
[im 83/187]
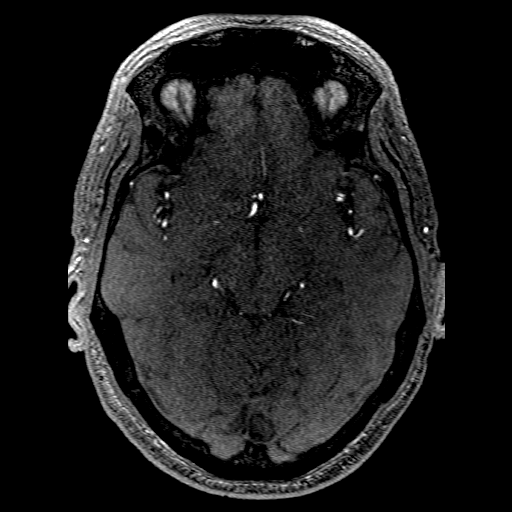
[im 96/187]
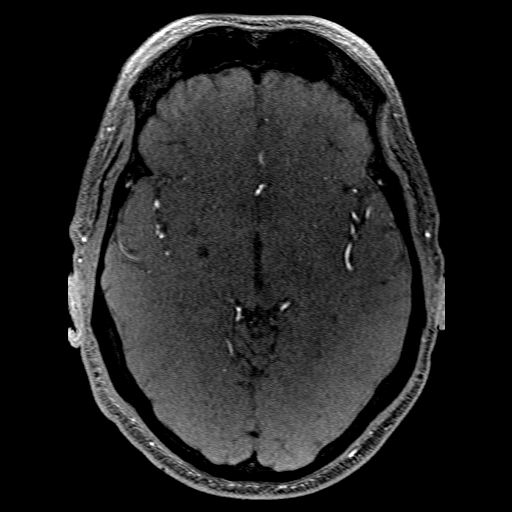
[im 104/187]
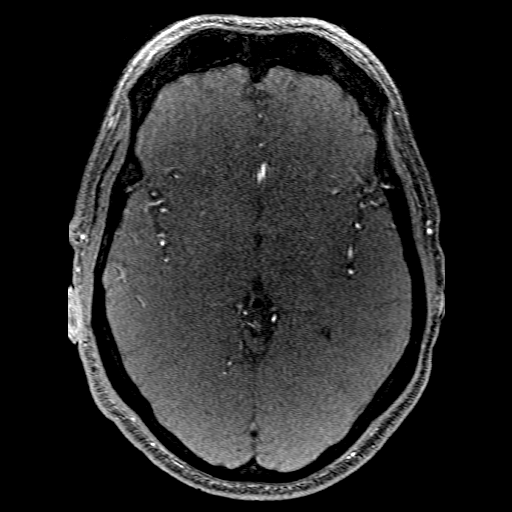
[im 130/187]
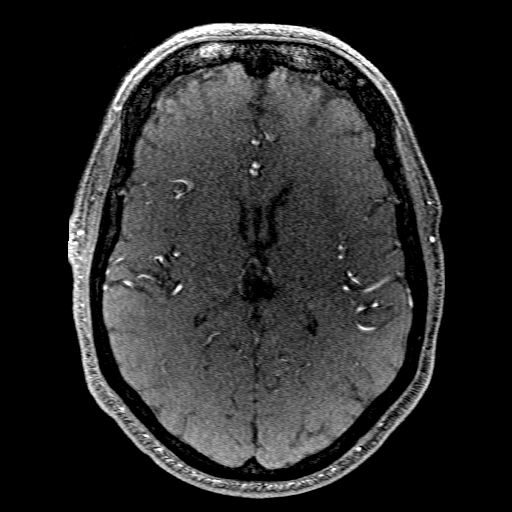
[im 152/187]
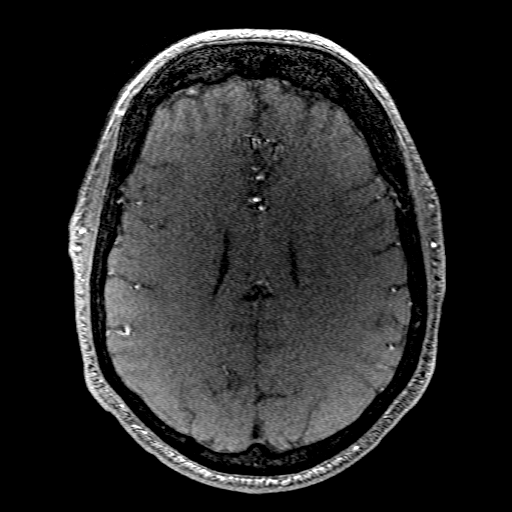
[im 156/187]
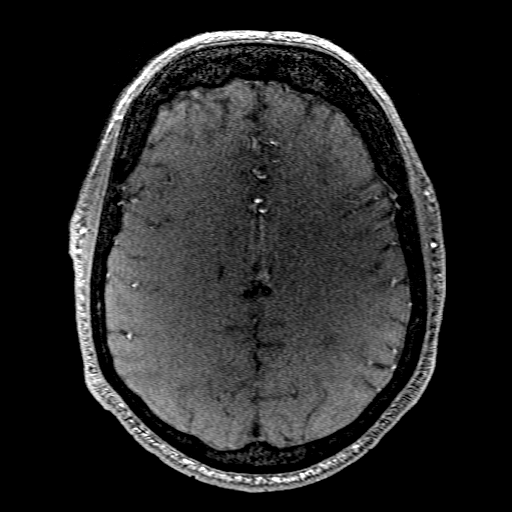
[im 178/187]
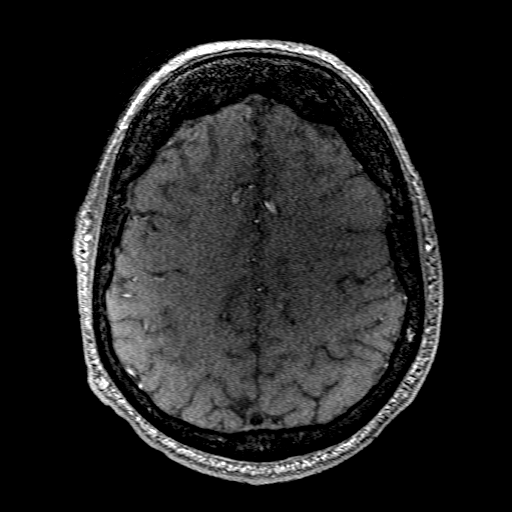

[Series 201: pjn:ax (id) · sagittal · 1.0mm · 0.43mm/px · 3 of 13 slices shown]
[im 1/13]
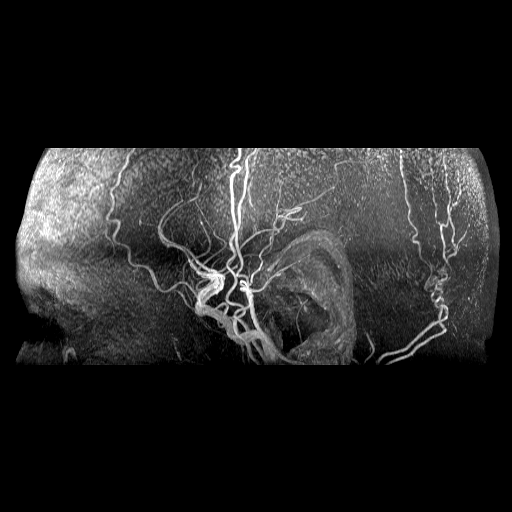
[im 7/13]
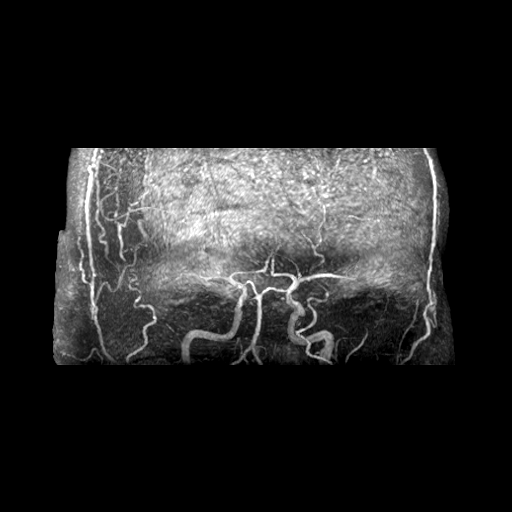
[im 13/13]
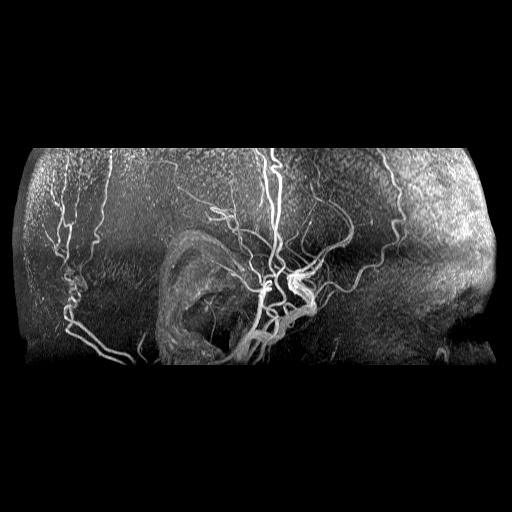

[Series 253: post. rotate · coronal · 1.0mm · 0.39mm/px · 1 of 3 slices shown]
[im 1/3]
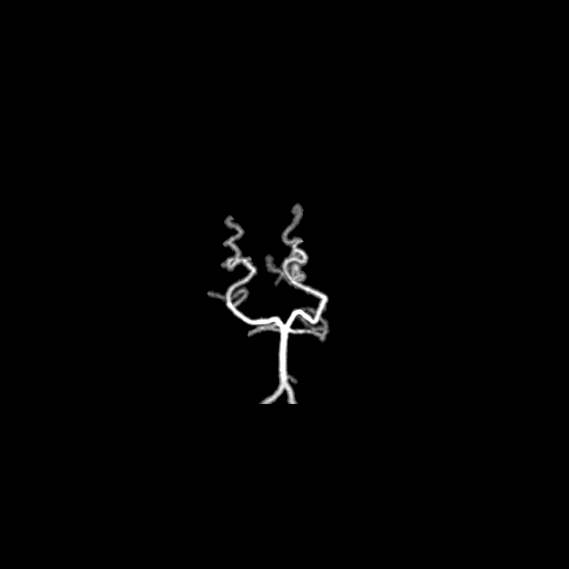

[18 of 48 positions shown; findings below may reference images not displayed]

FINDINGS: MRA NECK FINDINGS

Right carotid system: Limited evaluation proximally due to
technique/artifact. The visible common carotid artery appear patent
without significant stenosis. Limited evaluation at the carotid
bifurcation on time-of-flight due to motion. On postcontrast imaging
there appears to be moderate to severe stenosis of the proximal ICA.

Left carotid system: Limited evaluation proximally due to
artifact/technique. The visible common carotid artery appears patent
without significant stenosis. At the carotid bifurcation there is
probably mild stenosis.

Vertebral arteries: Limited evaluation of the vertebral artery
origins and proximal vertebral arteries due to artifact/technique.
The visualized vertebral arteries are patent bilaterally without
evidence of significant (greater than 50%) stenosis.

MRA HEAD FINDINGS

Anterior circulation: Bilateral intracranial ICAs, MCAs and ACAs are
patent without proximal hemodynamically significant stenosis. No
aneurysm identified.

Posterior circulation: Visualized intradural vertebral arteries,
basilar artery, and posterior cerebral arteries are patent without
proximal hemodynamically significant stenosis. Right posterior
communicating artery noted. No aneurysm identified.
IMPRESSION: MRA neck:

1. Limited study with suspected moderate to severe stenosis of the
proximal right ICA. A CTA could provide more confident quantitative
assessment if clinically indicated.
2. Probably mild stenosis of the left proximal ICA.

MRA head:

No large vessel occlusion or proximal hemodynamically significant
stenosis.

## 2021-04-16 IMAGING — MR MR MRA NECK WO/W CM
4 of 7 series · 18 of 48 positions shown · IV contrast (Yes GAD)
Comparison: None.

CLINICAL DATA: Neuro deficit, acute, stroke suspected R vision
loss; Neuro deficit, acute, stroke suspected

EXAM:
MRA NECK WITHOUT CONTRAST
MRA HEAD WITHOUT CONTRAST
TECHNIQUE: Angiographic images of the Circle of Willis were acquired using MRA
technique without intravenous contrast.

[Series 300: cor cemra ft · coronal · 1.2mm · 0.59mm/px · 6 of 117 slices shown]
[im 1/117]
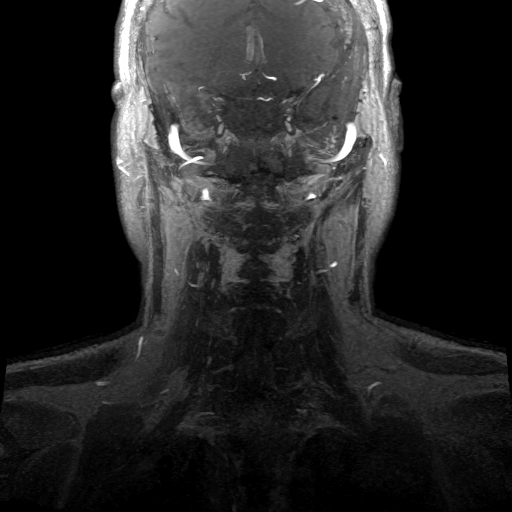
[im 24/117]
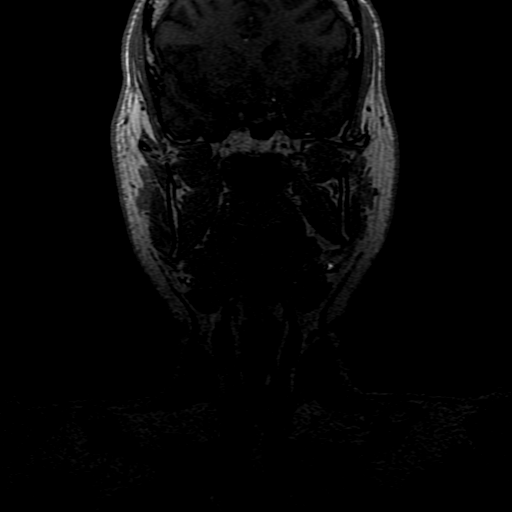
[im 47/117]
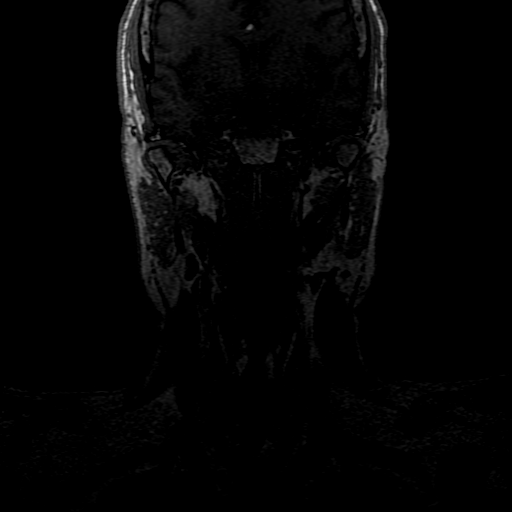
[im 70/117]
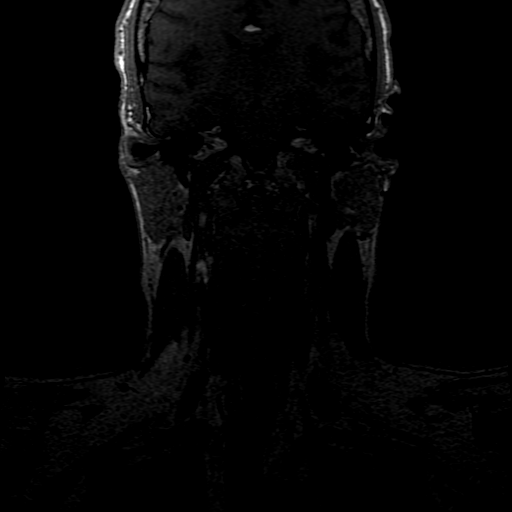
[im 93/117]
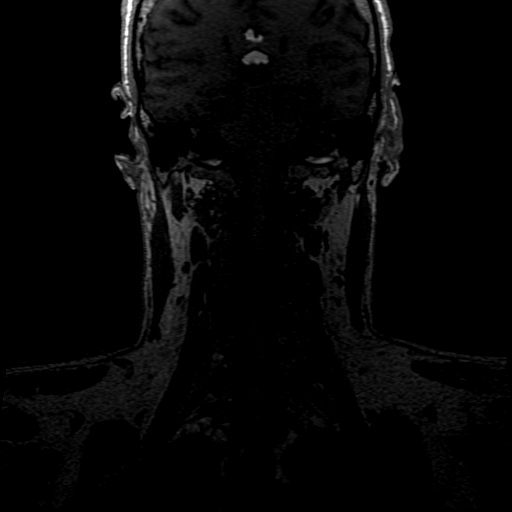
[im 117/117]
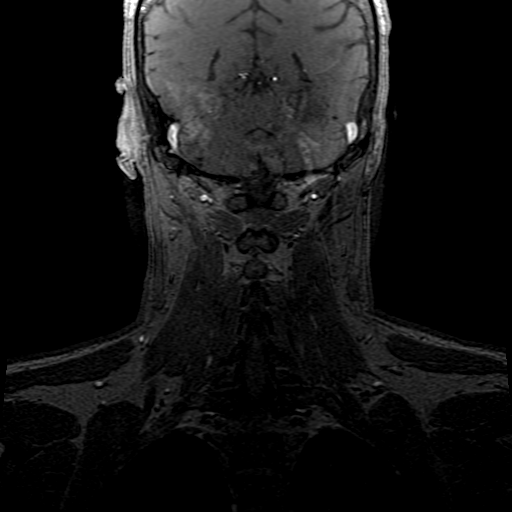

[Series 301: ph1/cor cemra ft · coronal · 1.2mm · 0.59mm/px · 6 of 116 slices shown]
[im 1/116]
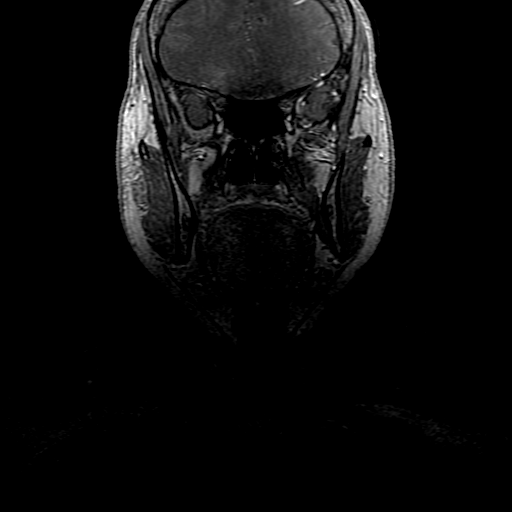
[im 24/116]
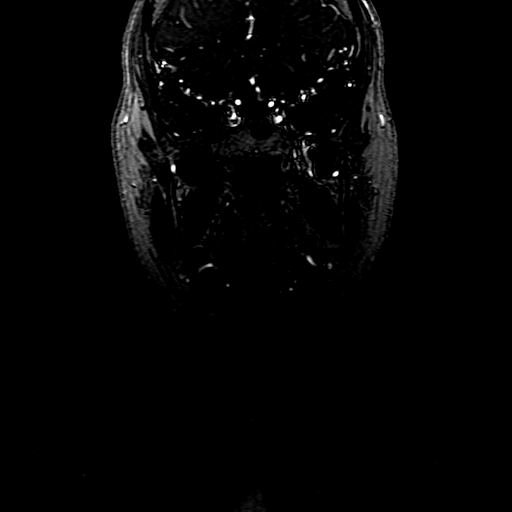
[im 47/116]
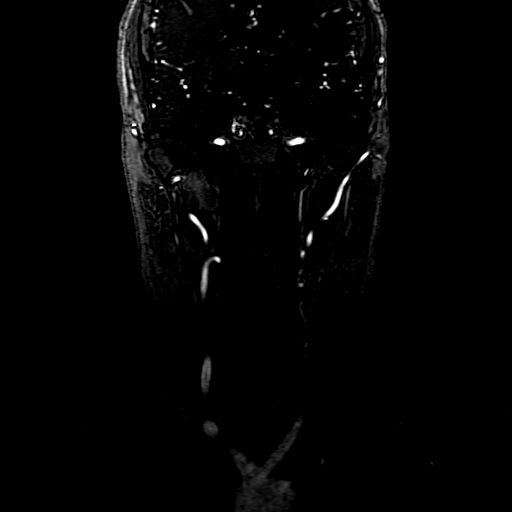
[im 70/116]
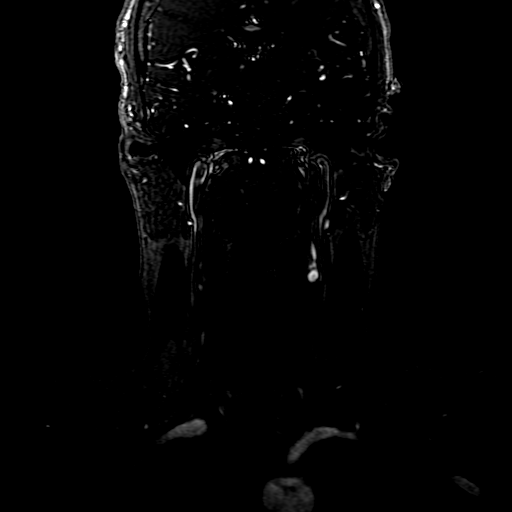
[im 93/116]
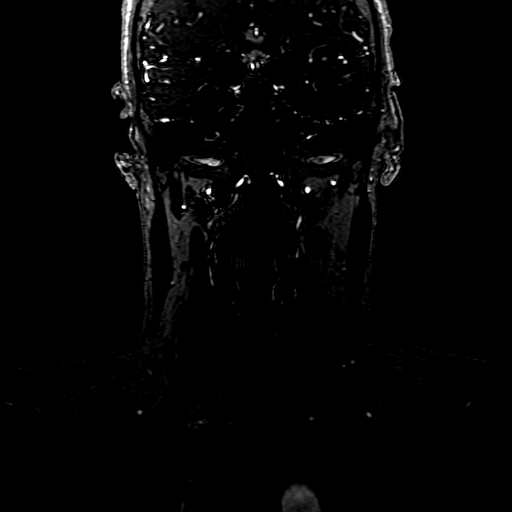
[im 116/116]
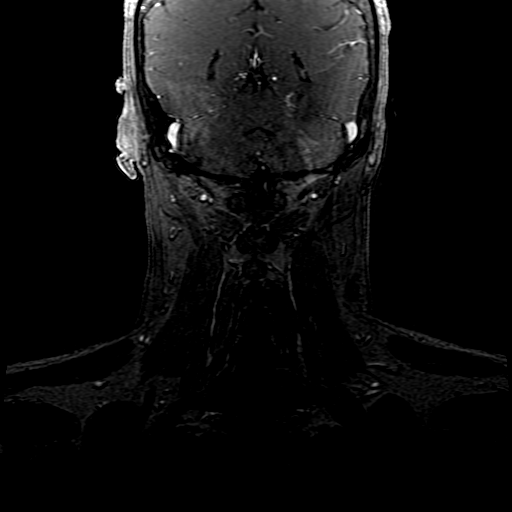

[Series 302: ph2/cor cemra ft · coronal · 1.2mm · 0.59mm/px · 3 of 116 slices shown]
[im 24/116]
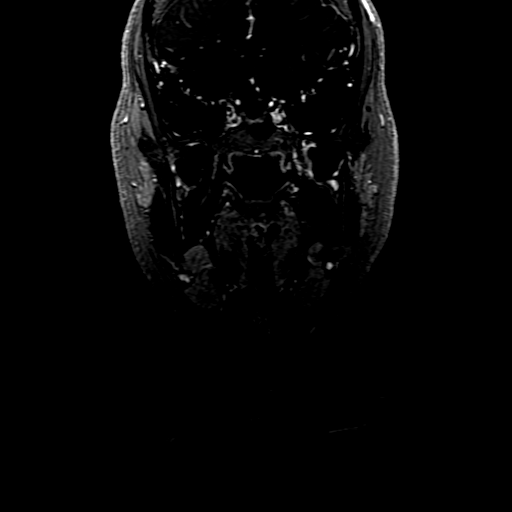
[im 70/116]
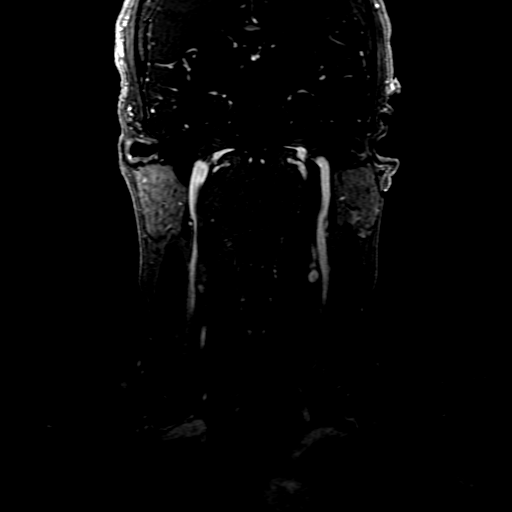
[im 116/116]
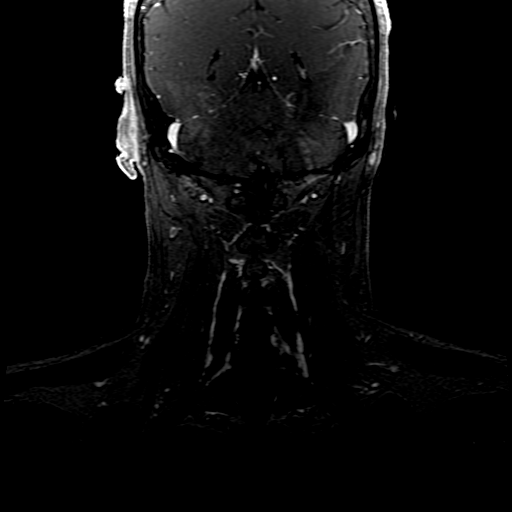

[((date))-((date)) · coronal · 1.2mm · 0.59mm/px · 3 of 117 slices shown]
[im 24/117]
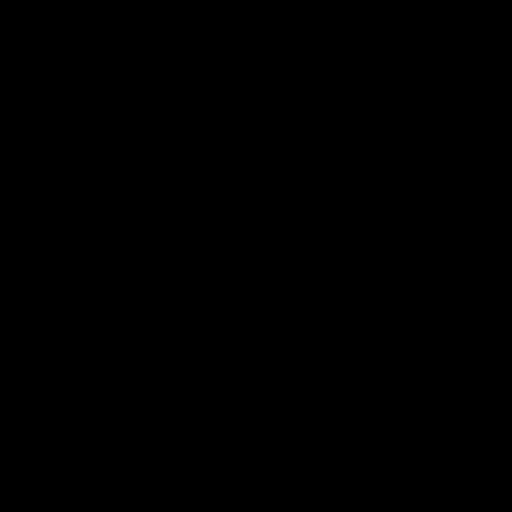
[im 70/117]
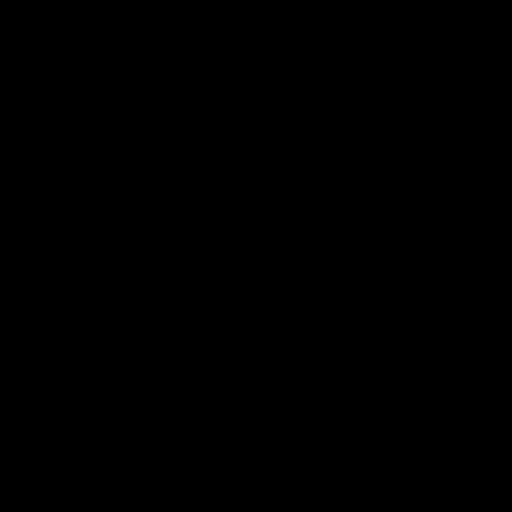
[im 117/117]
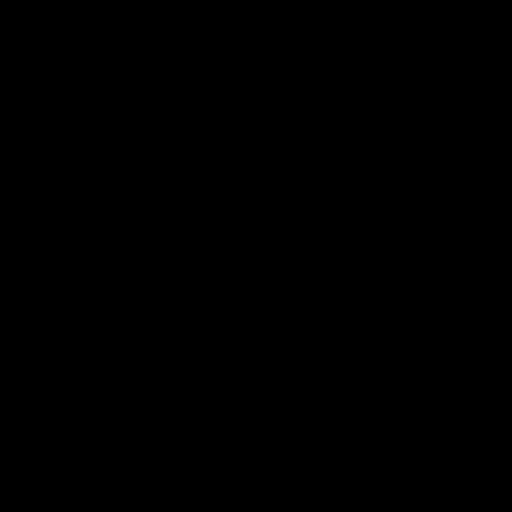

[18 of 48 positions shown; findings below may reference images not displayed]

FINDINGS: MRA NECK FINDINGS

Right carotid system: Limited evaluation proximally due to
technique/artifact. The visible common carotid artery appear patent
without significant stenosis. Limited evaluation at the carotid
bifurcation on time-of-flight due to motion. On postcontrast imaging
there appears to be moderate to severe stenosis of the proximal ICA.

Left carotid system: Limited evaluation proximally due to
artifact/technique. The visible common carotid artery appears patent
without significant stenosis. At the carotid bifurcation there is
probably mild stenosis.

Vertebral arteries: Limited evaluation of the vertebral artery
origins and proximal vertebral arteries due to artifact/technique.
The visualized vertebral arteries are patent bilaterally without
evidence of significant (greater than 50%) stenosis.

MRA HEAD FINDINGS

Anterior circulation: Bilateral intracranial ICAs, MCAs and ACAs are
patent without proximal hemodynamically significant stenosis. No
aneurysm identified.

Posterior circulation: Visualized intradural vertebral arteries,
basilar artery, and posterior cerebral arteries are patent without
proximal hemodynamically significant stenosis. Right posterior
communicating artery noted. No aneurysm identified.
IMPRESSION: MRA neck:

1. Limited study with suspected moderate to severe stenosis of the
proximal right ICA. A CTA could provide more confident quantitative
assessment if clinically indicated.
2. Probably mild stenosis of the left proximal ICA.

MRA head:

No large vessel occlusion or proximal hemodynamically significant
stenosis.

## 2021-04-16 MED ORDER — ASPIRIN 81 MG PO CHEW
81.0000 mg | CHEWABLE_TABLET | Freq: Every day | ORAL | Status: DC
  Administered 2021-04-16 – 2021-04-20 (×4): 81 mg via ORAL
  Filled 2021-04-16 (×4): qty 1

## 2021-04-16 MED ORDER — ACETAMINOPHEN 650 MG RE SUPP: 650.0000 mg | RECTAL | Status: DC | PRN

## 2021-04-16 MED ORDER — ATORVASTATIN CALCIUM 40 MG PO TABS
40.0000 mg | ORAL_TABLET | Freq: Every day | ORAL | Status: DC
  Administered 2021-04-16 – 2021-04-20 (×4): 40 mg via ORAL
  Filled 2021-04-16 (×4): qty 1

## 2021-04-16 MED ORDER — INSULIN ASPART 100 UNIT/ML IJ SOLN
0.0000 [IU] | Freq: Three times a day (TID) | INTRAMUSCULAR | Status: DC
  Administered 2021-04-17: 2 [IU] via SUBCUTANEOUS
  Administered 2021-04-17: 18:00:00 3 [IU] via SUBCUTANEOUS
  Administered 2021-04-17 – 2021-04-18 (×2): 2 [IU] via SUBCUTANEOUS
  Administered 2021-04-18: 17:00:00 9 [IU] via SUBCUTANEOUS
  Administered 2021-04-18: 1 [IU] via SUBCUTANEOUS
  Administered 2021-04-20: 2 [IU] via SUBCUTANEOUS

## 2021-04-16 MED ORDER — GADOBUTROL 1 MMOL/ML IV SOLN
10.0000 mL | Freq: Once | INTRAVENOUS | Status: AC | PRN
  Administered 2021-04-16: 10 mL via INTRAVENOUS

## 2021-04-16 MED ORDER — IOHEXOL 350 MG/ML SOLN
75.0000 mL | Freq: Once | INTRAVENOUS | Status: AC | PRN
  Administered 2021-04-16: 75 mL via INTRAVENOUS

## 2021-04-16 MED ORDER — ACETAMINOPHEN 160 MG/5ML PO SOLN: 650.0000 mg | ORAL | Status: DC | PRN

## 2021-04-16 MED ORDER — NICOTINE 21 MG/24HR TD PT24
21.0000 mg | MEDICATED_PATCH | Freq: Every day | TRANSDERMAL | Status: DC
  Administered 2021-04-16 – 2021-04-20 (×4): 21 mg via TRANSDERMAL
  Filled 2021-04-16 (×4): qty 1

## 2021-04-16 MED ORDER — STROKE: EARLY STAGES OF RECOVERY BOOK
Freq: Once | Status: AC
  Filled 2021-04-16: qty 1

## 2021-04-16 MED ORDER — ENOXAPARIN SODIUM 40 MG/0.4ML IJ SOSY
40.0000 mg | PREFILLED_SYRINGE | INTRAMUSCULAR | Status: DC
  Administered 2021-04-16 – 2021-04-18 (×3): 40 mg via SUBCUTANEOUS
  Filled 2021-04-16 (×3): qty 0.4

## 2021-04-16 MED ORDER — ACETAMINOPHEN 325 MG PO TABS: 650.0000 mg | ORAL_TABLET | ORAL | Status: DC | PRN

## 2021-04-16 MED ORDER — CLOPIDOGREL BISULFATE 75 MG PO TABS
75.0000 mg | ORAL_TABLET | Freq: Every day | ORAL | Status: DC
  Administered 2021-04-16 – 2021-04-20 (×4): 75 mg via ORAL
  Filled 2021-04-16 (×4): qty 1

## 2021-04-16 NOTE — Progress Notes (Signed)
°  Echocardiogram 2D Echocardiogram has been performed.  Martin Lee F 04/16/2021, 1:30 PM

## 2021-04-16 NOTE — ED Triage Notes (Signed)
Patient complains of ongoing blurry vision since 0400 yesterday, left yesterday prior to getting to treatment room. No other complaints

## 2021-04-16 NOTE — ED Notes (Signed)
Called lab to add LDL and Hgb A1C on to existing blood draw in lab.

## 2021-04-16 NOTE — ED Notes (Signed)
Patient transported to MRI 

## 2021-04-16 NOTE — ED Provider Triage Note (Signed)
Emergency Medicine Provider Triage Evaluation Note  Martin Lee , a 51 y.o. male  was evaluated in triage.  Pt complains of right sided vision loss. States that same began when he woke up yesterday morning.  Went to see ophthalmology who stated that he had a large saddle embolus of his right eye and should be worked up as a stroke patient.  He was seen and evaluated here yesterday at which point neurology was consulted and stated that he would need to be admitted and they would see the patient when he got back to a room to make further imaging recommendations, but he states he had to leave before going to her room due to family emergency. He states that today his vision has somewhat returned but is still blurry.  Review of Systems  Positive: R sided vision loss Negative: Headache, Fevers, pain, nausea, vomiting  Physical Exam  BP (!) 138/94    Pulse 87    Temp 98.3 F (36.8 C)    Resp 20    SpO2 99%  Gen:   Awake, no distress   Resp:  Normal effort  MSK:   Moves extremities without difficulty  Other:  PERRLA. EOMs intact  Medical Decision Making  Medically screening exam initiated at 10:36 AM.  Appropriate orders placed.  Bandy Honaker was informed that the remainder of the evaluation will be completed by another provider, this initial triage assessment does not replace that evaluation, and the importance of remaining in the ED until their evaluation is complete.     Silva Bandy, PA-C 04/16/21 1042

## 2021-04-16 NOTE — Evaluation (Signed)
Occupational Therapy Evaluation Patient Details Name: Martin Lee MRN: [id number] DOB: 07/29/1970 Today's Date: 04/16/2021   History of Present Illness 51 y.o. male presents 1 day after initially being seen and evaluated, now with concern for ongoing right sided vision loss.  MR Brain: Multiple small foci of restricted diffusion in the high right  frontal cortex, suspicious for acute infarcts. Mild associated edema  without mass effect.  2. 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  3. Mild chronic microvascular ischemic disease.  4. Pericallosal lipoma.   Clinical Impression   Patient admitted for the diagnosis above.  PTA he lives with his spouse, and needed no assist with any aspect of ADL/IADL or mobility.  Only deficit is peripheral/blurred vision to R eye.  Patient is not experiencing any functional declines, but is being admitted for further stroke workup.  As such OT can follow along as a transfer order will be place to ensure no changes. High level goals placed with discharge home anticipated.  Follow up with ophthalmologist as prescribed.       Recommendations for follow up therapy are one component of a multi-disciplinary discharge planning process, led by the attending physician.  Recommendations may be updated based on patient status, additional functional criteria and insurance authorization.   Follow Up Recommendations  No OT follow up    Assistance Recommended at Discharge PRN  Patient can return home with the following      Functional Status Assessment  Patient has not had a recent decline in their functional status  Equipment Recommendations  None recommended by OT    Recommendations for Other Services       Precautions / Restrictions Precautions Precautions: Fall Restrictions Weight Bearing Restrictions: No      Mobility Bed Mobility Overal bed mobility: Independent                  Transfers Overall  transfer level: Independent                        Balance Overall balance assessment: No apparent balance deficits (not formally assessed)                                         ADL either performed or assessed with clinical judgement   ADL Overall ADL's : At baseline                                             Vision Ability to See in Adequate Light: 1 Impaired Patient Visual Report: Blurring of vision;Peripheral vision impairment       Perception Perception Perception: Within Functional Limits   Praxis Praxis Praxis: Intact    Pertinent Vitals/Pain Pain Assessment: No/denies pain     Hand Dominance Right   Extremity/Trunk Assessment Upper Extremity Assessment Upper Extremity Assessment: Overall WFL for tasks assessed   Lower Extremity Assessment Lower Extremity Assessment: Overall WFL for tasks assessed   Cervical / Trunk Assessment Cervical / Trunk Assessment: Normal   Communication Communication Communication: No difficulties   Cognition Arousal/Alertness: Awake/alert Behavior During Therapy: WFL for tasks assessed/performed Overall Cognitive Status: Within Functional Limits for tasks assessed  General Comments   VSS on RA    Exercises     Shoulder Instructions      Home Living Family/patient expects to be discharged to:: Private residence Living Arrangements: Spouse/significant other Available Help at Discharge: Family;Available 24 hours/day Type of Home: House Home Access: Level entry     Home Layout: One level         Bathroom Toilet: Standard     Home Equipment: Conservation officer, nature (2 wheels);Cane - single point   Additional Comments: Had heart surgery and used RW and SPC while recovering.  does not use anymore.      Prior Functioning/Environment Prior Level of Function : Independent/Modified Independent;Driving              Mobility Comments: No assist ADLs Comments: No assist        OT Problem List: Impaired vision/perception      OT Treatment/Interventions: Self-care/ADL training;Visual/perceptual remediation/compensation;Therapeutic activities    OT Goals(Current goals can be found in the care plan section) Acute Rehab OT Goals Patient Stated Goal: Return of normal sight OT Goal Formulation: With patient Time For Goal Achievement: 04/30/21 Potential to Achieve Goals: Fair  OT Frequency: Min 2X/week    Co-evaluation              AM-PAC OT "6 Clicks" Daily Activity     Outcome Measure Help from another person eating meals?: None Help from another person taking care of personal grooming?: None Help from another person toileting, which includes using toliet, bedpan, or urinal?: None Help from another person bathing (including washing, rinsing, drying)?: None Help from another person to put on and taking off regular upper body clothing?: None Help from another person to put on and taking off regular lower body clothing?: None 6 Click Score: 24   End of Session Equipment Utilized During Treatment: Gait belt Nurse Communication: Mobility status  Activity Tolerance: Patient tolerated treatment well Patient left: in bed;with call bell/phone within reach  OT Visit Diagnosis: Other (comment) (visual impairment)                Time: [id number] OT Time Calculation (min): 18 min Charges:  OT General Charges $OT Visit: 1 Visit OT Evaluation $OT Eval Moderate Complexity: 1 Mod  04/16/2021  RP, OTR/L  Acute Rehabilitation Services  Office:  [phone]   Metta Clines 04/16/2021, 4:05 PM

## 2021-04-16 NOTE — ED Provider Notes (Signed)
[facility] EMERGENCY DEPARTMENT Provider Note   CSN: [id number] Arrival date & time: 04/16/21  1001     History  Chief Complaint  Patient presents with   abnormal CT    Martin Lee is a 51 y.o. male.  HPI Patient presents 1 day after initially being seen and evaluated now with concern for ongoing right sided vision loss.  History is obtained by the patient, his mother, and chart review.  Patient is a history of bypass, takes aspirin, no history of stroke.  He woke yesterday, about 30 hours ago with right vision loss entirely.  This is improved somewhat and now he describes ability to discern objects in the center portion of his vision.  No pain throughout, no fever, no nausea, no vomiting, no recent illness, no recent change in medication.    Home Medications Prior to Admission medications   Not on File      Allergies    Patient has no allergy information on record.    Review of Systems   Review of Systems  Constitutional:        Per HPI, otherwise negative  HENT:         Per HPI, otherwise negative  Eyes:  Positive for visual disturbance. Negative for pain.  Respiratory:         Per HPI, otherwise negative  Cardiovascular:        Per HPI, otherwise negative  Gastrointestinal:  Negative for vomiting.  Endocrine:       Negative aside from HPI  Genitourinary:        Neg aside from HPI   Musculoskeletal:        Per HPI, otherwise negative  Skin: Negative.   Neurological:  Negative for syncope.   Physical Exam Updated Vital Signs BP 125/87    Pulse 72    Temp 98.3 F (36.8 C)    Resp 12    SpO2 96%  Physical Exam Vitals and nursing note reviewed.  Constitutional:      General: He is not in acute distress.    Appearance: He is well-developed.  HENT:     Head: Normocephalic and atraumatic.  Eyes:     General: Lids are normal. Gaze aligned appropriately.     Extraocular Movements:     Right eye: Normal extraocular motion and no nystagmus.      Left eye: Normal extraocular motion and no nystagmus.     Conjunctiva/sclera: Conjunctivae normal.      Comments: Vision limited to central area with diminished capacity to discern anything beyond central area.  Cardiovascular:     Rate and Rhythm: Normal rate and regular rhythm.  Pulmonary:     Effort: Pulmonary effort is normal. No respiratory distress.     Breath sounds: No stridor.  Abdominal:     General: There is no distension.  Skin:    General: Skin is warm and dry.  Neurological:     Mental Status: He is alert and oriented to person, place, and time.     Motor: No weakness, tremor or atrophy.     Comments: Aside from right vision loss cranial nerves unremarkable    ED Results / Procedures / Treatments   Labs (all labs ordered are listed, but only abnormal results are displayed) Labs Reviewed  CBC - Abnormal; Notable for the following components:      Result Value   WBC 10.6 (*)    Hemoglobin 17.8 (*)    All other  components within normal limits  BASIC METABOLIC PANEL - Abnormal; Notable for the following components:   Glucose, Bld 139 (*)    All other components within normal limits    EKG None  Radiology CT HEAD WO CONTRAST (5MM)  Result Date: 04/15/2021 CLINICAL DATA:  Neuro deficit, acute stroke suspected. Reported to be unable to see out of his left eye upon awakening today. EXAM: CT HEAD WITHOUT CONTRAST TECHNIQUE: Contiguous axial images were obtained from the base of the skull through the vertex without intravenous contrast. COMPARISON:  None. FINDINGS: Brain: There is no evidence of acute intracranial hemorrhage, mass lesion, brain edema or extra-axial fluid collection. The ventricles and subarachnoid spaces are appropriately sized for age. There is no CT evidence of acute cortical infarction. There is a large curvilinear pericallosal lipoma, measuring up to 5.4 cm in length on image 31/6. Vascular: Intracranial vascular calcifications. No hyperdense vessel  identified. Skull: Negative for fracture or focal lesion. Sinuses/Orbits: Probable small mucous retention cyst inferiorly in the left maxillary sinus. The additional visualized paranasal sinuses, mastoid air cells and middle ears are clear. Other: None. IMPRESSION: 1. No acute intracranial findings demonstrated. No CT evidence of acute stroke or hemorrhage. 2. Incidental large curvilinear pericallosal lipoma. Electronically Signed   By: Richardean Sale M.D.   On: 04/15/2021 13:01    Procedures Procedures    Medications Ordered in ED Medications - No data to display  ED Course/ Medical Decision Making/ A&P                           Medical Decision Making Right differential including atherosclerotic plaque versus thromboembolic processes versus stroke versus neuropathy versus intrinsic orbital pathology considered.  MRI ordered, case discussed with neurology, labs from yesterday reviewed, notable for unremarkable results.  CT from yesterday also reviewed, unremarkable, no stroke, no hemorrhage. I discussed the patient's case with neurology at bedside. On cardiac monitor patient has sinus rhythm, rate 70s. Pulse ox 96% room air normal  Per neurology/ophthalmology patient had visible embolus on dilated exam.  Given this knowledge, concern for vision loss, concern for increased stroke risk patient will require admission.  Initial findings, and those from yesterday reviewed, as above, MRI pending, independently reviewed the CT scan, and echocardiogram is being performed at bedside, I reviewed these images as well.         Final Clinical Impression(s) / ED Diagnoses Final diagnoses:  Sudden visual loss of right eye    Rx / DC Orders   Carmin Muskrat, MD 04/16/21 1205

## 2021-04-16 NOTE — Consult Note (Signed)
Neurology Consultation  Reason for Consult: CRAO OD.  Referring Physician: Kirby Funk, MD.   CC: can not see out of OD.   History is obtained from: patient, chart.   HPI: Martin Lee is a 51 y.o. male with a PMHx of CAD s/p CABG and DM II who presented on 04/15/20 with c/o lost vision of OD and after being told by ophthalmologist to come to ED. However, before patient seen, he left for a family emergency, but returned today. Friday am, he got up around 0400 and noticed he couldn't see out of his OD. Says it was like a black curtain over the eye. If he covered his OS, he could not see. If he covered his OD, he could see normally for him. Now, he says the black curtain is not there, but his vision OD is doubled at times (items side by side) and blurry all the time. He can not discern NPs facial details, but can see that NP is there. He calls it "foggy". He is able to tell NP colors with both eyes opened.   Today, he says his vision has improved as far as not further black curtain, but blurriness continues. Denies numbness or tingling, dysphagia, dysarthria, aphasia, facial droop, or weakness of a particular limb.   He has never had a stroke or anything like this before. He takes baby ASA everyday since heart surgery, but is not on any other blood thinner.   Because of CRAO, neurology is asked to consult.   ROS: A robust ROS was performed and is negative except as noted in the HPI.   Past Medical History:  Diagnosis Date   Coronary artery disease    Diabetes mellitus without complication ([place])    No family history on file.  Social History:   reports that he has been smoking cigarettes. He has never used smokeless tobacco. He reports current alcohol use. He reports that he does not currently use drugs.  Medications  Current Facility-Administered Medications:    aspirin chewable tablet 81 mg, 81 mg, Oral, Daily, Derek Jack, MD, 81 mg at 04/16/21 1207   clopidogrel (PLAVIX) tablet 75  mg, 75 mg, Oral, Daily, Derek Jack, MD, 75 mg at 04/16/21 1207 No current outpatient medications on file.  Exam: Current vital signs: BP 117/85    Pulse 77    Temp 98.3 F (36.8 C)    Resp 18    SpO2 96%  Vital signs in last 24 hours: Temp:  [98.3 F (36.8 C)] 98.3 F (36.8 C) (01/07 1008) Pulse Rate:  [72-87] 77 (01/07 1130) Resp:  [12-20] 18 (01/07 1130) BP: (117-138)/(85-94) 117/85 (01/07 1130) SpO2:  [96 %-99 %] 96 % (01/07 1130)  PE: GENERAL: Well appearing male lying on ED stretcher in NAD. Echo tech at bedside. He is awake, alert.  HEENT: normocephalic and atraumatic.  LUNGS - Normal respiratory effort.  CV - RRR on tele. ABDOMEN - Soft, nontender. Ext: warm, well perfused. Psych: affect light.   NEURO:  Mental Status: Alert and oriented x4 Speech/Language: speech is without dysarthria or aphasia.  Naming, repetition, fluency, and comprehension intact.  Cranial Nerves:  II: PERRL 3 mm/brisk. He is unable to tell NP how many fingers NP is holding up with OD except in the lower outer corner of OD. He is able to tell they are fingers but unable to count them except as above. No problems with OS.  III, IV, VI: EOMI. Lid elevation symmetric and full.  V: sensation is intact and symmetrical to face.  VII: Smile is symmetrical.  VIII:hearing intact to voice. IX, X: palate elevation is symmetric. Phonation normal.  XI: normal sternocleidomastoid and trapezius muscle strength. [id number] is symmetrical without fasciculations.   Motor:  5/5 strength to all muscle groups tested.  Tone is normal. Bulk is normal.  Sensation- Intact to light touch bilaterally in all four extremities. Extinction absent to DSS.  Coordination: FTN intact bilaterally. HKS intact bilaterally. No pronator drift.  Gait- deferred.  NIHSS:  1a Level of Consciousness: 0 1b LOC Questions:0  1c LOC Commands: 0 2 Best Gaze: 0 3 Visual: 1 4 Facial Palsy: 0 5a Motor Arm - left: 0 5b Motor Arm -  Right: 0 6a Motor Leg - Left: 0 6b Motor Leg - Right: 00 7 Limb Ataxia:  8 Sensory: 0 9 Best Language: 0 10 Dysarthria: 0 11 Extinction and Inattention: 0 TOTAL: 1  Labs I have reviewed labs in epic and the results pertinent to this consultation are:  CBC    Component Value Date/Time   WBC 10.6 (H) 04/16/2021 1037   RBC 5.30 04/16/2021 1037   HGB 17.8 (H) 04/16/2021 1037   HCT 51.0 04/16/2021 1037   PLT 194 04/16/2021 1037   MCV 96.2 04/16/2021 1037   MCH 33.6 04/16/2021 1037   MCHC 34.9 04/16/2021 1037   RDW 12.3 04/16/2021 1037   LYMPHSABS 3.4 04/15/2021 1252   MONOABS 0.9 04/15/2021 1252   EOSABS 0.1 04/15/2021 1252   BASOSABS 0.0 04/15/2021 1252    CMP     Component Value Date/Time   NA 138 04/16/2021 1037   K 5.0 04/16/2021 1037   CL 106 04/16/2021 1037   CO2 23 04/16/2021 1037   GLUCOSE 139 (H) 04/16/2021 1037   BUN 11 04/16/2021 1037   CREATININE 0.78 04/16/2021 1037   CALCIUM 9.5 04/16/2021 1037   PROT 7.4 04/15/2021 1252   ALBUMIN 4.1 04/15/2021 1252   AST 32 04/15/2021 1252   ALT 30 04/15/2021 1252   ALKPHOS 96 04/15/2021 1252   BILITOT 0.9 04/15/2021 1252   GFRNONAA >60 04/16/2021 1037    Imaging MD reviewed the images obtained.  CT head Negative for acute finding.   MRI/MRA head and neck ordered.   Assessment: 51 yo male presenting with loss of OD vision. This has improved some but still unable to make out some objects. 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   Recommendations/Plan:  -MRI brain.  -MRA head and neck.  - ASA 81mg  daily + plavix 75mg  daily x21 days f/b plavix 75mg  daily monotherapy after that - Permissive hypertension up to 220/110 x48 hrs from sx onset. Avoid oral antihypertensives. After 48 hrs, goal normotension, with strict avoidance of hypotension in the setting of L ICA stenosis -fall precautions.  - See attending attestation for additional recommendations  Addendum: f/up MRI brain:   1. Multiple small foci of restricted diffusion in the high right frontal cortex, suspicious for acute infarcts. Mild associated edema without mass effect. 2. 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 3. 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  Patient is having stroke workup and is already on Plavix and ASA.   Patient seen by Clance Boll, NP/Neuro and later by MD. Note/plan to be edited by MD as needed.  Pager: [phone]  Neurology Attending Attestation   I examined the patient and discussed plan with NP. Above note has  been edited by me to reflect my findings and recommendations with the following additions: 51 yo man with pmhx CAD s/p CABG and DM2 who presented with complete loss of vision OD, now improved. Per ophtho, patient had visible saddle embolus on dilated exam. On my examination, patient is unable to differentiate between 1 and 2 fingers in any quadrant in R eye except inferior nasal quadrant, and has no other deificts. NIHSS = 2. Premorbid mRS = 0.  MRI brain additionally notable for multiple small foci restricted diffusion in high R frontal cortex c/f acute infarcts and multiple small remote infarcts (personal review). MRA head showed no LVO or hemodynamically significant stenosis. 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 TTE pending. Continue dual antiplatelet therapy with ASA + plavix, and high-intensity statin.   Su Monks, MD [facility] [phone]  If 7pm- 7am, please page neurology on call as listed in [place].

## 2021-04-16 NOTE — H&P (Addendum)
History and Physical    Martin Lee YIR:[id number] DOB: March 15, 1971 DOA: 04/16/2021  Referring MD/NP/PA: Gerhard Munch, MD PCP: Rebekah Chesterfield, NP  Patient coming from: Home  Chief Complaint: Vision change out of right  I have personally briefly reviewed patient's old medical records in [facility]   HPI: Martin Lee is a 51 y.o. male with medical history significant of CAD s/p CABG x4 in 2018, DM type II, SBO, and tobacco abuse presents with complaints of vision change of his right eye which started yesterday morning when he woke up.  Prior to that patient reported his vision has been normal.  Initially reported his seeming like a black curtain was over his eye, but notes that he has had some of his vision return out of the right eye.  States that everything is very blurry and can make out things if very close out of his right eye.  His vision is unchanged in the left eye.  Denies having any pain in the eye, trauma, headache, focal weakness, change in speech, fever, cough, shortness of breath, nausea, vomiting, or diarrhea.  He had been seen in the emergency department yesterday afternoon, but left prior to being formally admitted due to emergency.  He returns today as symptoms had not improved.  Patient does admit that he still smokes about 1 pack cigarettes per day on average, but was previously able to quit for about a year following his open heart surgery.  ED Course: Upon admission into the emergency department patient was noted to be afebrile, blood pressure 117/85-139/99, and all other vital signs maintained.  Labs significant for WBC 10.6, hemoglobin 17.8, glucose 139, and LDL 76.5.  CT scan of the head noted no acute intracranial findings or evidence of acute stroke or hemorrhage with incidental largelarge curvilinear pericallosal lipoma.  Patient had been evaluated by neurology who recommended checking MRI and MRA of the head and neck.  He was out of the window for tPA.  TRH  called to admit.  Patient was started on aspirin and Plavix.   Review of Systems  Constitutional:  Negative for fever.  Eyes:  Positive for blurred vision.  Cardiovascular:  Negative for chest pain.  Neurological:  Negative for focal weakness and headaches.  Psychiatric/Behavioral:  Positive for substance abuse.   Otherwise a 12 point review of systems was performed and negative except for as noted above in HPI  Past Medical History:  Diagnosis Date   Coronary artery disease    Diabetes mellitus without complication (HCC)     History reviewed. No pertinent surgical history.   reports that he has been smoking cigarettes. He has never used smokeless tobacco. He reports current alcohol use. He reports that he does not currently use drugs.  Not on File  History reviewed. No pertinent family history.  Prior to Admission medications   Not on File    Physical Exam:  Constitutional: NAD, calm, comfortable Vitals:   04/16/21 1130 04/16/21 1215 04/16/21 1230 04/16/21 1245  BP: 117/85 135/88 126/85 118/84  Pulse: 77 71 73 74  Resp: 18 13 12 15   Temp:      SpO2: 96% 96% 97% 96%   Eyes: PERRL, lids and conjunctivae normal ENMT: Mucous membranes are moist. Posterior pharynx clear of any exudate or lesions. Edentulous Neck: normal, supple, no masses, no thyromegaly Respiratory: clear to auscultation bilaterally, no wheezing, no crackles. Normal respiratory effort. No accessory muscle use.  Cardiovascular: Regular rate and rhythm, no murmurs / rubs /  gallops. No extremity edema.  Abdomen: no tenderness, no masses palpated. No hepatosplenomegaly. Bowel sounds positive.  Musculoskeletal: no clubbing / cyanosis. No joint deformity upper and lower extremities. Good ROM, no contractures. Normal muscle tone.  Skin: Healed median sternotomy scar from previous open heart surgery. Neurologic: Patient unable to discern how many fingers present if far away out of the right eye, but was able to  tell him any fingers holding up when holding them up closer to his nose.  Otherwise cranial nerves 2-12 grossly intact. Psychiatric: Normal judgment and insight. Alert and oriented x 3. Normal mood.     Labs on Admission: I have personally reviewed following labs and imaging studies  CBC: Recent Labs  Lab 04/15/21 1252 04/15/21 1351 04/16/21 1037  WBC 10.3  --  10.6*  NEUTROABS 5.8  --   --   HGB 17.3* 17.7* 17.8*  HCT 51.4 52.0 51.0  MCV 97.9  --  96.2  PLT 195  --  194   Basic Metabolic Panel: Recent Labs  Lab 04/15/21 1252 04/15/21 1351 04/16/21 1037  NA 136 139 138  K 4.6 4.4 5.0  CL 103 105 106  CO2 25  --  23  GLUCOSE 119* 114* 139*  BUN 10 12 11   CREATININE 0.76 0.70 0.78  CALCIUM 9.3  --  9.5   GFR: CrCl cannot be calculated (Unknown ideal weight.). Liver Function Tests: Recent Labs  Lab 04/15/21 1252  AST 32  ALT 30  ALKPHOS 96  BILITOT 0.9  PROT 7.4  ALBUMIN 4.1   No results for input(s): LIPASE, AMYLASE in the last 168 hours. No results for input(s): AMMONIA in the last 168 hours. Coagulation Profile: Recent Labs  Lab 04/15/21 1252  INR 1.0   Cardiac Enzymes: No results for input(s): CKTOTAL, CKMB, CKMBINDEX, TROPONINI in the last 168 hours. BNP (last 3 results) No results for input(s): PROBNP in the last 8760 hours. HbA1C: No results for input(s): HGBA1C in the last 72 hours. CBG: No results for input(s): GLUCAP in the last 168 hours. Lipid Profile: Recent Labs    04/16/21 1149  LDLDIRECT 76.5   Thyroid Function Tests: No results for input(s): TSH, T4TOTAL, FREET4, T3FREE, THYROIDAB in the last 72 hours. Anemia Panel: No results for input(s): VITAMINB12, FOLATE, FERRITIN, TIBC, IRON, RETICCTPCT in the last 72 hours. Urine analysis: No results found for: COLORURINE, APPEARANCEUR, LABSPEC, PHURINE, GLUCOSEU, HGBUR, BILIRUBINUR, KETONESUR, PROTEINUR, UROBILINOGEN, NITRITE, LEUKOCYTESUR Sepsis Labs: No results found for this or any  previous visit (from the past 240 hour(s)).   Radiological Exams on Admission: CT HEAD WO CONTRAST (5MM)  Result Date: 04/15/2021 CLINICAL DATA:  Neuro deficit, acute stroke suspected. Reported to be unable to see out of his left eye upon awakening today. EXAM: CT HEAD WITHOUT CONTRAST TECHNIQUE: Contiguous axial images were obtained from the base of the skull through the vertex without intravenous contrast. COMPARISON:  None. FINDINGS: Brain: There is no evidence of acute intracranial hemorrhage, mass lesion, brain edema or extra-axial fluid collection. The ventricles and subarachnoid spaces are appropriately sized for age. There is no CT evidence of acute cortical infarction. There is a large curvilinear pericallosal lipoma, measuring up to 5.4 cm in length on image 31/6. Vascular: Intracranial vascular calcifications. No hyperdense vessel identified. Skull: Negative for fracture or focal lesion. Sinuses/Orbits: Probable small mucous retention cyst inferiorly in the left maxillary sinus. The additional visualized paranasal sinuses, mastoid air cells and middle ears are clear. Other: None. IMPRESSION: 1. No acute intracranial  findings demonstrated. No CT evidence of acute stroke or hemorrhage. 2. Incidental large curvilinear pericallosal lipoma. Electronically Signed   By: Carey Bullocks M.D.   On: 04/15/2021 13:01    EKG: Independently reviewed.  Sinus tachycardia 101 bpm  Assessment/Plan Right eye vision loss secondary to CVA: Acute.  Patient presents with complaints of painless decreased vision out of the right eye starting since yesterday morning when he woke up.  Not a tPA candidate as out of the window due to onset of symptoms.  CT scan did not note any acute abnormality.  LDL was noted to be 76.5 subsequent MRI and MRA noted concern for acute infarcts of the right frontal cortex with moderate to severe stenosis of the proximal right ICA. -Admit to a telemetry bed -Neurochecks -Follow-up  echocardiogram -Aspirin and Plavix x21 days, and then aspirin alone -Start statin -Appreciate neurology consultative services, we will follow-up for any further recommendation  Leukocytosis: Acute. WBC 10.6.  Suspect likely secondary to above. -Recheck CBC in a.m.  Polycythemia: Hemoglobin 17.8 g/dL.  Suspect related with patient's history of tobacco abuse. -Continue to monitor -Recommend further work-up in the outpatient setting  CAD: Patient status post CABG  x4v in 2018.  Diabetes mellitus type 2: On admission glucose was 139.  At home patient reports being on Jardiance and metformin. -Follow-up hemoglobin A1c -Hold Jardiance and metformin  -CBGs before every meal with sensitive SSI -Adjust insulin regimen as needed  Hyperlipidemia: LDL was 76.5 -Goal HDL less than 70. -Atorvastatin 40 mg daily  Tobacco abuse: Patient reports smoking a pack cigarettes per day on average. -Nicotine patch offered  Med reconciliation not completed at this time.  DVT prophylaxis: Lovenox Code Status: Full Family Communication: None requested to be updated Disposition Plan: Hopefully discharge home once medically stable Consults called: Neurology Admission status: Inpatient, due to acute stroke  Clydie Braun MD [facility]   If 7PM-7AM, please contact night-coverage   04/16/2021, 1:32 PM

## 2021-04-17 ENCOUNTER — Inpatient Hospital Stay (HOSPITAL_COMMUNITY): Payer: Commercial Managed Care - PPO

## 2021-04-17 ENCOUNTER — Other Ambulatory Visit: Payer: Self-pay

## 2021-04-17 ENCOUNTER — Encounter (HOSPITAL_COMMUNITY): Payer: Self-pay | Admitting: Internal Medicine

## 2021-04-17 DIAGNOSIS — E1159 Type 2 diabetes mellitus with other circulatory complications: Secondary | ICD-10-CM

## 2021-04-17 DIAGNOSIS — I63031 Cerebral infarction due to thrombosis of right carotid artery: Secondary | ICD-10-CM

## 2021-04-17 DIAGNOSIS — H3411 Central retinal artery occlusion, right eye: Secondary | ICD-10-CM

## 2021-04-17 DIAGNOSIS — I639 Cerebral infarction, unspecified: Secondary | ICD-10-CM

## 2021-04-17 DIAGNOSIS — H539 Unspecified visual disturbance: Secondary | ICD-10-CM

## 2021-04-17 LAB — CBC
HCT: 49 % (ref 39.0–52.0)
Hemoglobin: 16.9 g/dL (ref 13.0–17.0)
MCH: 33.2 pg (ref 26.0–34.0)
MCHC: 34.5 g/dL (ref 30.0–36.0)
MCV: 96.3 fL (ref 80.0–100.0)
Platelets: 181 10*3/uL (ref 150–400)
RBC: 5.09 MIL/uL (ref 4.22–5.81)
RDW: 12.4 % (ref 11.5–15.5)
WBC: 9.9 10*3/uL (ref 4.0–10.5)
nRBC: 0 % (ref 0.0–0.2)

## 2021-04-17 LAB — GLUCOSE, CAPILLARY
Glucose-Capillary: 187 mg/dL — ABNORMAL HIGH (ref 70–99)
Glucose-Capillary: 162 mg/dL — ABNORMAL HIGH (ref 70–99)
Glucose-Capillary: 216 mg/dL — ABNORMAL HIGH (ref 70–99)
Glucose-Capillary: 127 mg/dL — ABNORMAL HIGH (ref 70–99)

## 2021-04-17 LAB — RAPID URINE DRUG SCREEN, HOSP PERFORMED
Amphetamines: NOT DETECTED
Barbiturates: NOT DETECTED
Benzodiazepines: POSITIVE — AB
Cocaine: NOT DETECTED
Opiates: NOT DETECTED
Tetrahydrocannabinol: POSITIVE — AB

## 2021-04-17 LAB — LIPID PANEL
Cholesterol: 117 mg/dL (ref 0–200)
HDL: 26 mg/dL — ABNORMAL LOW (ref >40)
LDL Cholesterol: 52 mg/dL (ref 0–99)
Total CHOL/HDL Ratio: 4.5 RATIO
Triglycerides: 195 mg/dL — ABNORMAL HIGH (ref <150)
VLDL: 39 mg/dL (ref 0–40)

## 2021-04-17 LAB — BASIC METABOLIC PANEL
Anion gap: 9 (ref 5–15)
BUN: 11 mg/dL (ref 6–20)
CO2: 20 mmol/L — ABNORMAL LOW (ref 22–32)
Calcium: 8.9 mg/dL (ref 8.9–10.3)
Chloride: 106 mmol/L (ref 98–111)
Creatinine, Ser: 0.94 mg/dL (ref 0.61–1.24)
GFR, Estimated: >60 mL/min (ref >60)
Glucose, Bld: 165 mg/dL — ABNORMAL HIGH (ref 70–99)
Potassium: 4.6 mmol/L (ref 3.5–5.1)
Sodium: 135 mmol/L (ref 135–145)

## 2021-04-17 LAB — C-REACTIVE PROTEIN: CRP: <0.5 mg/dL (ref <1.0)

## 2021-04-17 LAB — SEDIMENTATION RATE: Sed Rate: 3 mm/hr (ref 0–16)

## 2021-04-17 LAB — HIV ANTIBODY (ROUTINE TESTING W REFLEX): HIV Screen 4th Generation wRfx: NONREACTIVE

## 2021-04-17 MED ORDER — MAGNESIUM SULFATE IN D5W 1-5 GM/100ML-% IV SOLN
1.0000 g | Freq: Once | INTRAVENOUS | Status: AC
  Administered 2021-04-17: 1 g via INTRAVENOUS
  Filled 2021-04-17: qty 100

## 2021-04-17 NOTE — Progress Notes (Signed)
Received from ED.  Oriented to the room and call light.  All questions answered.  Stroke education initiated.

## 2021-04-17 NOTE — Plan of Care (Signed)
°  Problem: Education: °Goal: Knowledge of General Education information will improve °Description: Including pain rating scale, medication(s)/side effects and non-pharmacologic comfort measures °Outcome: Progressing °  °Problem: Health Behavior/Discharge Planning: °Goal: Ability to manage health-related needs will improve °Outcome: Progressing °  °Problem: Clinical Measurements: °Goal: Ability to maintain clinical measurements within normal limits will improve °Outcome: Progressing °Goal: Will remain free from infection °Outcome: Progressing °Goal: Diagnostic test results will improve °Outcome: Progressing °Goal: Respiratory complications will improve °Outcome: Progressing °Goal: Cardiovascular complication will be avoided °Outcome: Progressing °  °Problem: Activity: °Goal: Risk for activity intolerance will decrease °Outcome: Progressing °  °Problem: Nutrition: °Goal: Adequate nutrition will be maintained °Outcome: Progressing °  °Problem: Coping: °Goal: Level of anxiety will decrease °Outcome: Progressing °  °Problem: Elimination: °Goal: Will not experience complications related to bowel motility °Outcome: Progressing °Goal: Will not experience complications related to urinary retention °Outcome: Progressing °  °Problem: Pain Managment: °Goal: General experience of comfort will improve °Outcome: Progressing °  °Problem: Safety: °Goal: Ability to remain free from injury will improve °Outcome: Progressing °  °Problem: Skin Integrity: °Goal: Risk for impaired skin integrity will decrease °Outcome: Progressing °  °Problem: Education: °Goal: Knowledge of disease or condition will improve °Outcome: Progressing °Goal: Knowledge of secondary prevention will improve (SELECT ALL) °Outcome: Progressing °Goal: Knowledge of patient specific risk factors will improve (INDIVIDUALIZE FOR PATIENT) °Outcome: Progressing °Goal: Individualized Educational Video(s) °Outcome: Progressing °  °Problem: Coping: °Goal: Will verbalize  positive feelings about self °Outcome: Progressing °Goal: Will identify appropriate support needs °Outcome: Progressing °  °Problem: Health Behavior/Discharge Planning: °Goal: Ability to manage health-related needs will improve °Outcome: Progressing °  °Problem: Self-Care: °Goal: Verbalization of feelings and concerns over difficulty with self-care will improve °Outcome: Progressing °  °

## 2021-04-17 NOTE — Consult Note (Signed)
Hospital Consult    Reason for Consult: Concern for symptomatic right ICA stenosis Requesting Physician: Dr. Bess Harvest MRN #:  [id number]  History of Present Illness: This is a 51 y.o. male with known history of CAD, status post open heart surgery in 2018, who presented to the hospital with monocular vision changes in the right eye.  He was initially evaluated by his ophthalmologist who noted right retinal artery occlusion, during his stroke work-up in the [facility] emergency department, right frontal lobe stroke, and greater than 50% stenosis of the right internal carotid artery were appreciated.  Vascular was consulted for further recommendations.  On exam, Martin Lee was doing well.  He was surrounded by family.  He denied new symptoms of TIA or stroke, but continued to have blurry vision in the right eye.  Martin Lee is a 1 pack/day smoker with no previous history of neck surgery.  Past Medical History:  Diagnosis Date   Coronary artery disease    Diabetes mellitus without complication (HCC)     History reviewed. No pertinent surgical history.  No Known Allergies  Prior to Admission medications   Medication Sig Start Date End Date Taking? Authorizing Provider  ALPRAZolam Prudy Feeler) 1 MG tablet Take 1 mg by mouth 2 (two) times daily.   Yes [provider]  aspirin 325 MG EC tablet Take 325 mg by mouth in the morning.   Yes [provider]  atorvastatin (LIPITOR) 40 MG tablet Take 40 mg by mouth every evening.   Yes [provider]  empagliflozin (JARDIANCE) 25 MG TABS tablet Take 25 mg by mouth daily.   Yes [provider]  escitalopram (LEXAPRO) 10 MG tablet Take 10 mg by mouth daily.   Yes [provider]  metFORMIN (GLUCOPHAGE) 1000 MG tablet Take 1,000 mg by mouth 2 (two) times daily with a meal.   Yes [provider]  metoprolol tartrate (LOPRESSOR) 50 MG tablet Take 50 mg by mouth 2 (two) times daily.   Yes [provider]     Social History   Socioeconomic History   Marital status: Married    Spouse name: Not on file   Number of children: Not on file   Years of education: Not on file   Highest education level: Not on file  Occupational History   Not on file  Tobacco Use   Smoking status: Every Day    Types: Cigarettes   Smokeless tobacco: Never  Vaping Use   Vaping Use: Never used  Substance and Sexual Activity   Alcohol use: Yes   Drug use: Not Currently   Sexual activity: Not on file  Other Topics Concern   Not on file  Social History Narrative   Not on file   Social Determinants of Health   Financial Resource Strain: Not on file  Food Insecurity: Not on file  Transportation Needs: Not on file  Physical Activity: Not on file  Stress: Not on file  Social Connections: Not on file  Intimate Partner Violence: Not on file     History reviewed. No pertinent family history.  ROS: Otherwise negative unless mentioned in HPI  Physical Examination  Vitals:   04/17/21 0755 04/17/21 1138  BP: 130/87 (!) 152/97  Pulse: 81 85  Resp: 14 14  Temp: 98.7 F (37.1 C) 98.2 F (36.8 C)  SpO2: 95% 96%   Body mass index is 26.47 kg/m.  General:  WDWN in NAD Gait: Not observed HENT: WNL, normocephalic Pulmonary: normal non-labored breathing, without  Rales, rhonchi,  wheezing Cardiac: regular Abdomen:  soft, NT/ND, no masses Skin: without rashes Vascular Exam/Pulses: 2 radial pulses bilaterally Extremities: without ischemic changes, without Gangrene , without cellulitis; without open wounds;  Musculoskeletal: no muscle wasting or atrophy  Neurologic: A&O X 3;  No focal weakness or paresthesias are detected; speech is fluent/normal Psychiatric:  The pt has Normal affect. Lymph:  Unremarkable  CBC    Component Value Date/Time   WBC 9.9 04/17/2021 0552   RBC 5.09 04/17/2021 0552   HGB 16.9 04/17/2021 0552   HCT 49.0 04/17/2021 0552   PLT 181 04/17/2021 0552   MCV 96.3 04/17/2021  0552   MCH 33.2 04/17/2021 0552   MCHC 34.5 04/17/2021 0552   RDW 12.4 04/17/2021 0552   LYMPHSABS 3.4 04/15/2021 1252   MONOABS 0.9 04/15/2021 1252   EOSABS 0.1 04/15/2021 1252   BASOSABS 0.0 04/15/2021 1252    BMET    Component Value Date/Time   NA 135 04/17/2021 0552   K 4.6 04/17/2021 0552   CL 106 04/17/2021 0552   CO2 20 (L) 04/17/2021 0552   GLUCOSE 165 (H) 04/17/2021 0552   BUN 11 04/17/2021 0552   CREATININE 0.94 04/17/2021 0552   CALCIUM 8.9 04/17/2021 0552   GFRNONAA >60 04/17/2021 0552    COAGS: Lab Results  Component Value Date   INR 1.0 04/15/2021     Non-Invasive Vascular Imaging:   Noninvasive vascular imaging from 04/16/2021 was reviewed demonstrating greater than 50% stenosis of the right internal carotid artery. Less than 50% stenosis of the left internal carotid artery   ASSESSMENT/PLAN: This is a 51 y.o. male with symptomatic right internal carotid artery stenosis.  I had a long discussion with both he and his family regarding his options.  These include medical management versus cerebrovascular intervention.  I quoted the NASCET trial which demonstrates a risk reduction from 26% to 9% with carotid revascularization.  Surgical options include carotid endarterectomy versus transcarotid artery stenting.  I described how long-term durability of stenting is unknown, and therefore I believe he would be best fit for carotid endarterectomy. After discussing the risks and benefits of surgery, Martin Lee elected to proceed.  I will add him onto my schedule for Tuesday for Right carotid endarterectomy.    Please continue his current medical therapy which includes dual antiplatelet therapy, high-intensity statin.  Fara Olden MD [facility] [phone] 04/17/2021  2:21 PM

## 2021-04-17 NOTE — Evaluation (Signed)
Physical Therapy Evaluation Patient Details Name: Martin Lee MRN: [id number] DOB: Apr 15, 1970 Today's Date: 04/17/2021  History of Present Illness  51 y.o. male presents 1 day after initially being seen and evaluated, now with concern for ongoing right sided vision loss.  MR Brain: Multiple small foci of restricted diffusion in the high right  frontal cortex, suspicious for acute infarcts. Mild associated edema  without mass effect.  2. 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  3. Mild chronic microvascular ischemic disease.  4. Pericallosal lipoma.   Clinical Impression  PT eval complete. Pt is independent with all functional mobility, including amb without AD 500' and ascend/descend a flight of stairs. Pt scored 24/24 on DGI balance test. LE strength is intact and symmetrical. No further skilled PT intervention indicated. PT signing off.       Recommendations for follow up therapy are one component of a multi-disciplinary discharge planning process, led by the attending physician.  Recommendations may be updated based on patient status, additional functional criteria and insurance authorization.  Follow Up Recommendations No PT follow up    Assistance Recommended at Discharge None  Patient can return home with the following       Equipment Recommendations None recommended by PT  Recommendations for Other Services       Functional Status Assessment       Precautions / Restrictions Precautions Precautions: None      Mobility  Bed Mobility Overal bed mobility: Independent                  Transfers Overall transfer level: Independent Equipment used: None                    Ambulation/Gait Ambulation/Gait assistance: Independent Gait Distance (Feet): 500 Feet Assistive device: None Gait Pattern/deviations: WFL(Within Functional Limits)   Gait velocity interpretation: >2.62 ft/sec, indicative of community  ambulatory      Stairs Stairs: Yes Stairs assistance: Modified independent (Device/Increase time) Stair Management: One rail Right;Alternating pattern;Forwards Number of Stairs: 12    Wheelchair Mobility    Modified Rankin (Stroke Patients Only) Modified Rankin (Stroke Patients Only) Pre-Morbid Rankin Score: No symptoms Modified Rankin: No significant disability     Balance Overall balance assessment: Independent                               Standardized Balance Assessment Standardized Balance Assessment : Dynamic Gait Index   Dynamic Gait Index Level Surface: Normal Change in Gait Speed: Normal Gait with Horizontal Head Turns: Normal Gait with Vertical Head Turns: Normal Gait and Pivot Turn: Normal Step Over Obstacle: Normal Step Around Obstacles: Normal Steps: Normal Total Score: 24       Pertinent Vitals/Pain Pain Assessment: No/denies pain    Home Living Family/patient expects to be discharged to:: Private residence Living Arrangements: Spouse/significant other Available Help at Discharge: Family;Available 24 hours/day Type of Home: House Home Access: Level entry       Home Layout: One level Home Equipment: Agricultural consultant (2 wheels);Cane - single point Additional Comments: Had heart surgery and used RW and SPC while recovering.  does not use anymore.    Prior Function Prior Level of Function : Independent/Modified Independent;Driving;Working/employed                     Hand Dominance   Dominant Hand: Right    Extremity/Trunk Assessment  Upper Extremity Assessment Upper Extremity Assessment: Overall WFL for tasks assessed    Lower Extremity Assessment Lower Extremity Assessment: Overall WFL for tasks assessed (symmetrical)    Cervical / Trunk Assessment Cervical / Trunk Assessment: Normal  Communication   Communication: No difficulties  Cognition Arousal/Alertness: Awake/alert Behavior During Therapy: WFL for  tasks assessed/performed Overall Cognitive Status: Within Functional Limits for tasks assessed                                          General Comments General comments (skin integrity, edema, etc.): VSS on RA    Exercises     Assessment/Plan    PT Assessment Patient does not need any further PT services  PT Problem List         PT Treatment Interventions      PT Goals (Current goals can be found in the Care Plan section)  Acute Rehab PT Goals Patient Stated Goal: home PT Goal Formulation: All assessment and education complete, DC therapy    Frequency       Co-evaluation               AM-PAC PT "6 Clicks" Mobility  Outcome Measure Help needed turning from your back to your side while in a flat bed without using bedrails?: None Help needed moving from lying on your back to sitting on the side of a flat bed without using bedrails?: None Help needed moving to and from a bed to a chair (including a wheelchair)?: None Help needed standing up from a chair using your arms (e.g., wheelchair or bedside chair)?: None Help needed to walk in hospital room?: None Help needed climbing 3-5 steps with a railing? : None 6 Click Score: 24    End of Session Equipment Utilized During Treatment: Gait belt Activity Tolerance: Patient tolerated treatment well Patient left: in bed;with call bell/phone within reach Nurse Communication: Mobility status PT Visit Diagnosis: Difficulty in walking, not elsewhere classified (R26.2)    Time: 2458-0998 PT Time Calculation (min) (ACUTE ONLY): 11 min   Charges:   PT Evaluation $PT Eval Low Complexity: 1 Low          Aida Raider, PT  Office # [phone] Pager [phone]   Ilda Foil 04/17/2021, 8:33 AM

## 2021-04-17 NOTE — Progress Notes (Signed)
Notified by tele of small run of trigeminy, frequent PVC's and small runs of vtach (no longer than 6 beats).  MD notified.

## 2021-04-17 NOTE — Progress Notes (Addendum)
STROKE TEAM PROGRESS NOTE   ATTENDING NOTE: I reviewed above note and agree with the assessment and plan. Pt was seen and examined.   51 year old male with history of CAD status post CABG, diabetes, smoker presented with right eye painless vision loss.  CT no acute abnormality.  MRI showed right frontal 5-6 punctate infarcts, old left occipital and left caudate head lacunar infarct.  CTA head and neck showed bilateral ICA calcified and soft plaque with right ICA 50% stenosis, bilateral V4 and ICA siphon atherosclerosis.  Overall intracranial and extracranial atherosclerosis out of proportion to his age.  EF 50 to 55%, LDL 76.5, A1c 7.4, UDS positive for THC.  Creatinine 0.94, ESR and CRP negative.  On exam, Martin Lee right eye able to see hand waving but not able to count fingers.  Otherwise neurological intact, no focal deficit.  His right CRAO and right frontal infarct likely due to symptomatic right ICA stenosis.  Vascular surgery consultation placed and Dr. Shirlean Mylar considered right ICA next Tuesday.  Continue aspirin Plavix DAPT and duration per vascular surgery.  Continue Lipitor 40.  Extensive education on smoking and THC cessation, currently on nicotine patch.  Aggressive risk factor modification including better DM control.  For detailed assessment and plan, please refer to above as I have made changes wherever appropriate.   Neurology will sign off. Please call with questions. Pt will follow up with stroke clinic NP at [facility] in about 4 weeks. Thanks for the consult.  Rosalin Hawking, MD PhD Stroke Neurology 04/17/2021 5:28 PM    INTERVAL HISTORY Martin Lee seen in room with family at the bedside. He has been up and walking around. He states that his vision seems a little better in his right eye but is still blurry and the top half of his visual field of his right eye is black. We discussed the importance of quitting smoking and endorsing a cardiac healthy diet, especially given his cardiac history.  Plan for plavix and aspirin right now and a vascular surgery consult for ICA stenosis. ESR and CRP are within normal range. Ha1C 7.4, Martin Lee takes metformin and jardiance at home. LDL 52, atorvastatin 63m resumed. He states that he has never had any diabetic neuropathy or retinopathy in the past. He takes a baby ASA daily since his CABG.    Vitals:   04/17/21 0100 04/17/21 0220 04/17/21 0403 04/17/21 0755  BP: 130/81 126/82 126/90 130/87  Pulse: 77 71 77 81  Resp: _0 Temp:   97.9 F (36.6 C) 98.7 F (37.1 C)  TempSrc:   Oral Oral  SpO2: 95% 96% 98% 95%  Weight:   81.3 kg   Height:   _1  (1.753 m)    CBC:  Recent Labs  Lab 04/15/21 1252 04/15/21 1351 04/16/21 1037 04/17/21 0552  WBC 10.3  --  10.6* 9.9  NEUTROABS 5.8  --   --   --   HGB 17.3*   < > 17.8* 16.9  HCT 51.4   < > 51.0 49.0  MCV 97.9  --  96.2 96.3  PLT 195  --  194 181   < > = values in this interval not displayed.   Basic Metabolic Panel:  Recent Labs  Lab 04/16/21 1037 04/17/21 0552  NA 138 135  K 5.0 4.6  CL 106 106  CO2 23 20*  GLUCOSE 139* 165*  BUN 11 11  CREATININE 0.78 0.94  CALCIUM 9.5 8.9   Lipid Panel: No results for  input(s): CHOL, TRIG, HDL, CHOLHDL, VLDL, LDLCALC in the last 168 hours. HgbA1c:  Recent Labs  Lab 04/16/21 1149  HGBA1C 7.4*   Urine Drug Screen: No results for input(s): LABOPIA, COCAINSCRNUR, LABBENZ, AMPHETMU, THCU, LABBARB in the last 168 hours.  Alcohol Level  Recent Labs  Lab 04/15/21 1252  ETH <10    IMAGING past 24 hours CT ANGIO HEAD NECK W WO CM  Result Date: 04/16/2021 CLINICAL DATA:  Acute neurologic deficit. EXAM: CT ANGIOGRAPHY HEAD AND NECK TECHNIQUE: Multidetector CT imaging of the head and neck was performed using the standard protocol during bolus administration of intravenous contrast. Multiplanar CT image reconstructions and MIPs were obtained to evaluate the vascular anatomy. Carotid stenosis measurements (when applicable) are obtained  utilizing NASCET criteria, using the distal internal carotid diameter as the denominator. CONTRAST:  103m OMNIPAQUE IOHEXOL 350 MG/ML SOLN COMPARISON:  Brain MRI/MRA 04/16/2021 FINDINGS: CT HEAD FINDINGS Brain: No hemorrhage or extra-axial collection. Pericallosal lipoma again noted. The size and configuration of the ventricles and extra-axial CSF spaces are normal. There is no acute or chronic infarction. The brain parenchyma is normal. Skull: The visualized skull base, calvarium and extracranial soft tissues are normal. Sinuses/Orbits: No fluid levels or advanced mucosal thickening of the visualized paranasal sinuses. No mastoid or middle ear effusion. The orbits are normal. CTA NECK FINDINGS SKELETON: There is no bony spinal canal stenosis. No lytic or blastic lesion. OTHER NECK: Normal pharynx, larynx and major salivary glands. No cervical lymphadenopathy. Unremarkable thyroid gland. UPPER CHEST: No pneumothorax or pleural effusion. No nodules or masses. AORTIC ARCH: There is calcific atherosclerosis of the aortic arch. There is no aneurysm, dissection or hemodynamically significant stenosis of the visualized portion of the aorta. Conventional 3 vessel aortic branching pattern. The visualized proximal subclavian arteries are widely patent. RIGHT CAROTID SYSTEM: No dissection, occlusion or aneurysm. There is predominantly low density atherosclerosis extending into the proximal ICA, resulting in 50% stenosis. LEFT CAROTID SYSTEM: No dissection, occlusion or aneurysm. There is predominantly low density atherosclerosis extending into the proximal ICA, resulting in less than 50% stenosis. VERTEBRAL ARTERIES: Codominant configuration. Both origins are clearly patent. There is no dissection, occlusion or flow-limiting stenosis to the skull base (V1-V3 segments). CTA HEAD FINDINGS POSTERIOR CIRCULATION: --Vertebral arteries: Bilateral atherosclerotic calcification with moderate stenosis, but both remain patent.  --Inferior cerebellar arteries: Normal. --Basilar artery: Normal. --Superior cerebellar arteries: Normal. --Posterior cerebral arteries (PCA): Normal. ANTERIOR CIRCULATION: --Intracranial internal carotid arteries: Normal. --Anterior cerebral arteries (ACA): Normal. Both A1 segments are present. Patent anterior communicating artery (a-comm). --Middle cerebral arteries (MCA): Normal. VENOUS SINUSES: As permitted by contrast timing, patent. ANATOMIC VARIANTS: None Review of the MIP images confirms the above findings. IMPRESSION: 1. No emergent large vessel occlusion. 2. Bilateral proximal ICA plaque causing 50% stenosis on the right and no hemodynamically significant stenosis on the left. 3. Moderate bilateral vertebral artery V4 segment atherosclerotic calcification with moderate stenosis, but maintained patency. 4. Pericallosal lipoma, unchanged. Aortic atherosclerosis (ICD10-I70.0). Electronically Signed   By: KUlyses JarredM.D.   On: 04/16/2021 21:13   MR ANGIO HEAD WO CONTRAST  Result Date: 04/16/2021 CLINICAL DATA:  Neuro deficit, acute, stroke suspected R vision loss; Neuro deficit, acute, stroke suspected EXAM: MRA NECK WITHOUT CONTRAST MRA HEAD WITHOUT CONTRAST TECHNIQUE: Angiographic images of the Circle of Willis were acquired using MRA technique without intravenous contrast. COMPARISON:  None. FINDINGS: MRA NECK FINDINGS Right carotid system: Limited evaluation proximally due to technique/artifact. The visible common carotid artery appear patent without significant  stenosis. Limited evaluation at the carotid bifurcation on time-of-flight due to motion. On postcontrast imaging there appears to be moderate to severe stenosis of the proximal ICA. Left carotid system: Limited evaluation proximally due to artifact/technique. The visible common carotid artery appears patent without significant stenosis. At the carotid bifurcation there is probably mild stenosis. Vertebral arteries: Limited evaluation of the  vertebral artery origins and proximal vertebral arteries due to artifact/technique. The visualized vertebral arteries are patent bilaterally without evidence of significant (greater than 50%) stenosis. MRA HEAD FINDINGS Anterior circulation: Bilateral intracranial ICAs, MCAs and ACAs are patent without proximal hemodynamically significant stenosis. No aneurysm identified. Posterior circulation: Visualized intradural vertebral arteries, basilar artery, and posterior cerebral arteries are patent without proximal hemodynamically significant stenosis. Right posterior communicating artery noted. No aneurysm identified. IMPRESSION: MRA neck: 1. Limited study with suspected moderate to severe stenosis of the proximal right ICA. A CTA could provide more confident quantitative assessment if clinically indicated. 2. Probably mild stenosis of the left proximal ICA. MRA head: No large vessel occlusion or proximal hemodynamically significant stenosis. Electronically Signed   By: Margaretha Sheffield M.D.   On: 04/16/2021 15:27   MR Angiogram Neck W or Wo Contrast  Result Date: 04/16/2021 CLINICAL DATA:  Neuro deficit, acute, stroke suspected R vision loss; Neuro deficit, acute, stroke suspected EXAM: MRA NECK WITHOUT CONTRAST MRA HEAD WITHOUT CONTRAST TECHNIQUE: Angiographic images of the Circle of Willis were acquired using MRA technique without intravenous contrast. COMPARISON:  None. FINDINGS: MRA NECK FINDINGS Right carotid system: Limited evaluation proximally due to technique/artifact. The visible common carotid artery appear patent without significant stenosis. Limited evaluation at the carotid bifurcation on time-of-flight due to motion. On postcontrast imaging there appears to be moderate to severe stenosis of the proximal ICA. Left carotid system: Limited evaluation proximally due to artifact/technique. The visible common carotid artery appears patent without significant stenosis. At the carotid bifurcation there is  probably mild stenosis. Vertebral arteries: Limited evaluation of the vertebral artery origins and proximal vertebral arteries due to artifact/technique. The visualized vertebral arteries are patent bilaterally without evidence of significant (greater than 50%) stenosis. MRA HEAD FINDINGS Anterior circulation: Bilateral intracranial ICAs, MCAs and ACAs are patent without proximal hemodynamically significant stenosis. No aneurysm identified. Posterior circulation: Visualized intradural vertebral arteries, basilar artery, and posterior cerebral arteries are patent without proximal hemodynamically significant stenosis. Right posterior communicating artery noted. No aneurysm identified. IMPRESSION: MRA neck: 1. Limited study with suspected moderate to severe stenosis of the proximal right ICA. A CTA could provide more confident quantitative assessment if clinically indicated. 2. Probably mild stenosis of the left proximal ICA. MRA head: No large vessel occlusion or proximal hemodynamically significant stenosis. Electronically Signed   By: Margaretha Sheffield M.D.   On: 04/16/2021 15:27   MR BRAIN WO CONTRAST  Result Date: 04/16/2021 CLINICAL DATA:  Neuro deficit, acute, stroke suspected EXAM: MRI HEAD WITHOUT CONTRAST TECHNIQUE: Multiplanar, multiecho pulse sequences of the brain and surrounding structures were obtained without intravenous contrast. COMPARISON:  Same day CT head. FINDINGS: Brain: Multiple small foci of restricted diffusion in the high right frontal cortex, suspicious for acute infarcts. Small areas of cortical T2 hyperintensity without restricted diffusion in the left parietal cortex, compatible with prior infarcts. Remote lacunar infarct in the left caudate. Scattered small T2/FLAIR hyperintensities in the white matter, nonspecific but compatible with mild chronic microvascular ischemic disease. Discrete T2 hyperintensities in the right greater than left basal ganglia are compatible with benign  dilated perivascular spaces. No evidence of  acute hemorrhage, hydrocephalus, mass lesion, midline shift or extra-axial fluid collection. Pericallosal lipoma, as seen on same day CT head. Cavum septum pellucidum et vergae, anatomic variant. Vascular: See forthcoming MRA. Skull and upper cervical spine: Normal marrow signal. Sinuses/Orbits: Mild paranasal sinus mucosal thickening. Unremarkable orbits. Other: Trace mastoid effusions. IMPRESSION: 1. Multiple small foci of restricted diffusion in the high right frontal cortex, suspicious for acute infarcts. Mild associated edema without mass effect. 2. 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 3. 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 Electronically Signed   By: Margaretha Sheffield M.D.   On: 04/16/2021 14:40   ECHOCARDIOGRAM COMPLETE BUBBLE STUDY  Result Date: 04/16/2021    ECHOCARDIOGRAM REPORT   Martin Lee Name:   Martin Lee Date of Exam: 04/16/2021 Medical Rec #:  [id number]    Height: Accession #:    [id number]   Weight: Date of Birth:  September 08, 1970    BSA: Martin Lee Age:    46 years     BP:           118/84 mmHg Martin Lee Gender: M            HR:           68 bpm. Exam Location:  Inpatient Procedure: 2D Echo, Cardiac Doppler, Color Doppler and Saline Contrast Bubble            Study Indications:    Stroke  History:        Martin Lee has no prior history of Echocardiogram examinations.                 CAD; Prior CABG.  Sonographer:    Merrie Roof RDCS Referring Phys: [place]  1. Basal inferio/inferolateral hypokinesis. . Left ventricular ejection fraction, by estimation, is 50 to 55%. The left ventricle has low normal function. The left ventricular internal cavity size was moderately dilated.  2. Right ventricular systolic function is normal. The right ventricular size is normal.  3. The mitral valve is normal in structure. Trivial mitral valve regurgitation.  4. The aortic valve  is normal in structure. Aortic valve regurgitation is not visualized. FINDINGS  Left Ventricle: Basal inferio/inferolateral hypokinesis. Left ventricular ejection fraction, by estimation, is 50 to 55%. The left ventricle has low normal function. The left ventricular internal cavity size was moderately dilated. There is no left ventricular hypertrophy. Right Ventricle: The right ventricular size is normal. Right vetricular wall thickness was not assessed. Right ventricular systolic function is normal. Left Atrium: Left atrial size was normal in size. Right Atrium: Right atrial size was normal in size. Pericardium: There is no evidence of pericardial effusion. Mitral Valve: The mitral valve is normal in structure. Trivial mitral valve regurgitation. Tricuspid Valve: The tricuspid valve is normal in structure. Tricuspid valve regurgitation is trivial. Aortic Valve: The aortic valve is normal in structure. Aortic valve regurgitation is not visualized. Aortic valve mean gradient measures 3.0 mmHg. Aortic valve peak gradient measures 5.5 mmHg. Aortic valve area, by VTI measures 1.90 cm. Pulmonic Valve: The pulmonic valve was normal in structure. Pulmonic valve regurgitation is not visualized. Aorta: The aortic root is normal in size and structure. IAS/Shunts: No atrial level shunt detected by color flow Doppler. Agitated saline contrast was given intravenously to evaluate for intracardiac shunting.  LEFT VENTRICLE PLAX 2D LVIDd:         5.60 cm   Diastology LVIDs:         4.00 cm  LV e' medial:    8.38 cm/s LV PW:         0.90 cm   LV E/e' medial:  10.1 LV IVS:        0.70 cm   LV e' lateral:   10.20 cm/s LVOT diam:     2.00 cm   LV E/e' lateral: 8.3 LV SV:         43 LVOT Area:     3.14 cm  RIGHT VENTRICLE RV Basal diam:  3.40 cm RV S prime:     8.27 cm/s TAPSE (M-mode): 1.4 cm LEFT ATRIUM             RIGHT ATRIUM LA diam:        4.30 cm RA Area:     13.00 cm LA Vol (A2C):   35.1 ml RA Volume:   30.70 ml LA Vol  (A4C):   26.8 ml LA Biplane Vol: 30.6 ml  AORTIC VALVE AV Area (Vmax):    1.74 cm AV Area (Vmean):   1.66 cm AV Area (VTI):     1.90 cm AV Vmax:           117.00 cm/s AV Vmean:          78.700 cm/s AV VTI:            0.226 m AV Peak Grad:      5.5 mmHg AV Mean Grad:      3.0 mmHg LVOT Vmax:         64.70 cm/s LVOT Vmean:        41.600 cm/s LVOT VTI:          0.137 m LVOT/AV VTI ratio: 0.61  AORTA Ao Root diam: 3.30 cm Ao Asc diam:  3.30 cm MITRAL VALVE MV Area (PHT): 4.80 cm    SHUNTS MV Decel Time: 158 msec    Systemic VTI:  0.14 m MV E velocity: 84.80 cm/s  Systemic Diam: 2.00 cm MV A velocity: 65.60 cm/s MV E/A ratio:  1.29 Dorris Carnes MD Electronically signed by Dorris Carnes MD Signature Date/Time: 04/16/2021/2:11:04 PM    Final     PHYSICAL EXAM  Physical Exam  Constitutional: Appears well-developed and well-nourished.  Psych: Affect appropriate to situation Eyes: No scleral injection Cardiovascular: Normal rate and regular rhythm.  Respiratory: Effort normal, non-labored breathing  Neuro: Mental Status: Martin Lee is awake, alert, oriented to person, place, month, year, and situation. Martin Lee is able to give a clear and coherent history. No signs of aphasia or neglect Cranial Nerves: II: Right eye upper visual field deficit, In lower visual fields he is able to see hand waving, but states that it is blurry. PERRLA III,IV, VI: EOMI without ptosis or diploplia.  V: Facial sensation is symmetric to temperature VII: Facial movement is symmetric resting and smiling VIII: Hearing is intact to voice X: Palate elevates symmetrically XI: Shoulder shrug is symmetric. XII: Tongue protrudes midline without atrophy or fasciculations.  Motor: Tone is normal. Bulk is normal. 5/5 strength was present in all four extremities.  Sensory: Sensation is symmetric to light touch and temperature in the arms and legs. No extinction to DSS present.  Deep Tendon Reflexes: 2+ and symmetric in the biceps and  patellae.  Coordination: FNF and HKS are intact bilaterally  ASSESSMENT/PLAN Martin Lee is a 51 y.o. male with history of CAD s/p CABG x4 in 2018, DM type II, SBO, and tobacco abuse presents with complaints of vision change of his right  eye which started yesterday morning when he woke up. He was told by his ophthalmologist to come to the ED. 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 Friday morning, he got up around 0400 and noticed he couldn't see out of his right eye. Says it was like a black curtain over the eye. If he covered his left eye, he could not see at all. If he covered his OD, he could see normally for him. As of Saturday, he says the black curtain is not there, but his vision OD is doubled at times (items side by side) and blurry all the time. He could not discern NPs facial details, but could tell that NP is there. He calls it "foggy". He was able to tell NP colors with both eyes opened. He says his vision has improved as far as no further black curtain, but the blurriness continues. Denies numbness or tingling, dysphagia, dysarthria, aphasia, facial droop, or weakness of a particular limb. He has never had a stroke or anything like this before. He takes baby ASA everyday since heart surgery, but is not on any other blood thinner. CT showed no acute abnormality. CTA head and neck showed bilateral proximal ICA plaque and about 50% stenosis. Vascular surgery consulted and they ordered carotid dopplers. MRI showed diffusion in the right frontal cortex, suspicious for acute infarct and small remote left parietal cortical and left caudate lacunar infarct. ESR and CRP ordered to check for temporal arteritis   R CRAO and stroke with right frontal lobe small punctate infarcts likely secondary due to symptomatic R ICA stenosis Code Stroke -No acute  abnormality.  CTA head & neck Bilateral proximal ICA plaque causing 50% stenosis on the right and no hemodynamically significant stenosis on the left. Moderate bilateral vertebral artery V4 segment atherosclerotic calcification with moderate stenosis, but maintained patency. MRI- Multiple small foci of restricted diffusion in the high right frontal cortex, suspicious for acute infarcts. Mild associated edema without mass effect. 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  Carotid Doppler - Pending 2D Echo- EF 50-55%, low normal LV function. No shunt noted.  LDL- 52 HgbA1c 7.4 Sed rate- 3 CRP- <0.5 VTE prophylaxis - Lovenox aspirin 81 mg daily prior to admission, now on aspirin 81 mg daily and clopidogrel 75 mg daily DAPT, duration per VVS Therapy recommendations:  Pending  Disposition:  Pending  Symptomatic Right ICA Stenosis Vascular surgery consult Right carotid endarterectomy on Tuesday  Hypertension Home meds:  metoprolol tartrate 29FA Stable- systolic range 213-086 currently Maintain 130-160 before carotid revascularization Long-term BP goal normotensive  Hyperlipidemia Home meds:  atorvastatin 51m, resumed in hospital LDL 52., goal < 70 Continue statin at discharge  Diabetes type II Uncontrolled Home meds:  metformin, jardiance  HgbA1c 7.4, goal < 7.0 CBGs  SSI  Tobacco abuse Current smoker, 1PPD Smoking cessation counseling provided. Extensive discussion about the reasons to quit smoking including additional strokes, furthering cardiac and lung disease.  Nicotine patch provided Pt is willing to quit  Other Stroke Risk Factors Advanced Age >/= 614 Coronary artery disease, s/p CABG ASA 88mat home  Other Active Problems Anxiety/depression Lexapro 1049mxanax 1mg60mospital day # 1  Martin Lee seen and examined by NP/APP with MD. MD to update note as needed.   DevoJanine OresP, FNP-BC [facility] Pager:  [phone]   To contact Stroke Continuity provider, please refer to [email or web address] After hours, contact General Neurology

## 2021-04-17 NOTE — Progress Notes (Signed)
VASCULAR LAB    Carotid duplex has been performed.  See CV proc for preliminary results.   Kynan Peasley, RVT 04/17/2021, 6:25 PM

## 2021-04-17 NOTE — Progress Notes (Addendum)
PROGRESS NOTE                                                                                                                                                                                                             Patient Demographics:    Martin Lee, is a 51 y.o. male, DOB - 10-08-1970, OZY:[id number]  Outpatient Primary MD for the patient is Rebekah Chesterfield, NP    LOS - 1  Admit date - 04/16/2021    Chief Complaint  Patient presents with   abnormal CT       Brief Narrative (HPI from H&P)     Martin Lee is a 51 y.o. male with medical history significant of CAD s/p CABG x4 in 2018, DM type II, SBO, and tobacco abuse presents with complaints of vision change of his right eye which started yesterday morning when he woke up, initially came to the ER but left before ER MD could see him, he subsequently came back to the hospital where he was diagnosed with right ICA stenosis, right frontal lobe stroke and right retinal artery occlusion.   Subjective:    Martin Lee today has, No headache, No chest pain, No abdominal pain - No Nausea, No new weakness tingling or numbness,    Assessment  & Plan :     Acute right eye vision loss-due to acute right central artery occlusion - in a patient with history of hypertension, ongoing nicotine abuse, MRI evidence of right frontal CVA, right ICA stenosis.  He has been seen by ophthalmology in the office already, neurology on board, currently on aspirin Plavix and statin for secondary prevention.  Stroke work-up per stroke team.  We will also have vascular surgery take a look at the patient for right ICA stenosis.  2.  Smoking.  Counseled to quit smoking  3.  Hypertension.  Allow for permissive hypertension due to right frontal CVA.  4.  DM type II.  Sliding scale insulin, continue home regimen upon discharge.  5.  Dyslipidemia.  On statin with satisfactory LDL.  6.  Incidental  finding of wall motion abnormality on echocardiogram has underlying CAD with CABG.  Chest pain-free and nonacute EKG, already on aspirin beta-blocker and statin at home for secondary prevention which will be continued, strictly counseled to stop smoking.  Outpatient cardiology follow-up.  Lab  Results  Component Value Date   HGBA1C 7.4 (H) 04/16/2021    Lab Results  Component Value Date   LDLDIRECT 76.5 04/16/2021         Condition - Fair  Family Communication  :  None present  Code Status :  Full  Consults  :  Neuro, VVS  PUD Prophylaxis :    Procedures  :     MRI -  1. Multiple small foci of restricted diffusion in the high right frontal cortex, suspicious for acute infarcts. Mild associated edema without mass effect. 2. 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 3. 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  MRA - MRA neck: 1. Limited study with suspected moderate to severe stenosis of the proximal right ICA. A CTA could provide more confident quantitative assessment if clinically indicated. 2. Probably mild stenosis of the left proximal ICA. MRA head: No large vessel occlusion or proximal hemodynamically significant stenosis.  CTA - 1. No emergent large vessel occlusion. 2. Bilateral proximal ICA plaque causing 50% stenosis on the right and no hemodynamically significant stenosis on the left. 3. Moderate bilateral vertebral artery V4 segment atherosclerotic calcification with moderate stenosis, but maintained patency. 4. Pericallosal lipoma, unchanged. Aortic atherosclerosis   TTE - 1. Basal inferio/inferolateral hypokinesis. . Left ventricular ejection fraction, by estimation, is 50 to 55%. The left ventricle has low normal function. The left ventricular internal cavity size was moderately dilated.  2. Right ventricular systolic function is normal. The right ventricular size is normal.  3. The mitral valve is  normal in structure. Trivial mitral valve regurgitation.  4. The aortic valve is normal in structure. Aortic valve regurgitation is not visualized.       Disposition Plan  :    Status is: Inpatient  Remains inpatient appropriate because: R eye vision loss  DVT Prophylaxis  :    enoxaparin (LOVENOX) injection 40 mg Start: 04/16/21 1600    Lab Results  Component Value Date   PLT 181 04/17/2021    Diet :  Diet Order             Diet Heart Room service appropriate? Yes; Fluid consistency: Thin  Diet effective now                    Inpatient Medications  Scheduled Meds:  aspirin  81 mg Oral Daily   atorvastatin  40 mg Oral Daily   clopidogrel  75 mg Oral Daily   enoxaparin (LOVENOX) injection  40 mg Subcutaneous Q24H   insulin aspart  0-9 Units Subcutaneous TID WC   nicotine  21 mg Transdermal Daily   Continuous Infusions: PRN Meds:.acetaminophen **OR** acetaminophen (TYLENOL) oral liquid 160 mg/5 mL **OR** acetaminophen  Antibiotics  :    Anti-infectives (From admission, onward)    None        Time Spent in minutes  30   Susa Raring M.D on 04/17/2021 at 10:01 AM  To page go to [email or web address]   [facility] -  Office  [phone]  See all Orders from today for further details    Objective:   Vitals:   04/17/21 0100 04/17/21 0220 04/17/21 0403 04/17/21 0755  BP: 130/81 126/82 126/90 130/87  Pulse: 77 71 77 81  Resp: 14 14 16 14   Temp:   97.9 F (36.6 C) 98.7 F (37.1 C)  TempSrc:   Oral Oral  SpO2: 95% 96% 98% 95%  Weight:  81.3 kg   Height:   5\' 9"  (1.753 m)     Wt Readings from Last 3 Encounters:  04/17/21 81.3 kg     Intake/Output Summary (Last 24 hours) at 04/17/2021 1001 Last data filed at 04/17/2021 0900 Gross per 24 hour  Intake 480 ml  Output --  Net 480 ml     Physical Exam  Awake Alert, No new F.N deficits, Normal affect [place].AT,PERRAL Supple Neck, No JVD,   Symmetrical Chest wall movement, Good air movement  bilaterally, CTAB RRR,No Gallops,Rubs or new Murmurs,  +ve B.Sounds, Abd Soft, No tenderness,   No Cyanosis, Clubbing or edema       Data Review:    CBC Recent Labs  Lab 04/15/21 1252 04/15/21 1351 04/16/21 1037 04/17/21 0552  WBC 10.3  --  10.6* 9.9  HGB 17.3* 17.7* 17.8* 16.9  HCT 51.4 52.0 51.0 49.0  PLT 195  --  194 181  MCV 97.9  --  96.2 96.3  MCH 33.0  --  33.6 33.2  MCHC 33.7  --  34.9 34.5  RDW 12.3  --  12.3 12.4  LYMPHSABS 3.4  --   --   --   MONOABS 0.9  --   --   --   EOSABS 0.1  --   --   --   BASOSABS 0.0  --   --   --     Electrolytes Recent Labs  Lab 04/15/21 1252 04/15/21 1351 04/16/21 1037 04/16/21 1149 04/17/21 0552  NA 136 139 138  --  135  K 4.6 4.4 5.0  --  4.6  CL 103 105 106  --  106  CO2 25  --  23  --  20*  GLUCOSE 119* 114* 139*  --  165*  BUN 10 12 11   --  11  CREATININE 0.76 0.70 0.78  --  0.94  CALCIUM 9.3  --  9.5  --  8.9  AST 32  --   --   --   --   ALT 30  --   --   --   --   ALKPHOS 96  --   --   --   --   BILITOT 0.9  --   --   --   --   ALBUMIN 4.1  --   --   --   --   INR 1.0  --   --   --   --   HGBA1C  --   --   --  7.4*  --     ------------------------------------------------------------------------------------------------------------------ Recent Labs    04/16/21 1149  LDLDIRECT 76.5    Lab Results  Component Value Date   HGBA1C 7.4 (H) 04/16/2021    No results for input(s): TSH, T4TOTAL, T3FREE, THYROIDAB in the last 72 hours.  Invalid input(s): FREET3 ------------------------------------------------------------------------------------------------------------------ ID Labs Recent Labs  Lab 04/15/21 1252 04/15/21 1351 04/16/21 1037 04/17/21 0552  WBC 10.3  --  10.6* 9.9  PLT 195  --  194 181  CREATININE 0.76 0.70 0.78 0.94   Cardiac Enzymes No results for input(s): CKMB, TROPONINI, MYOGLOBIN in the last 168 hours.  Invalid input(s): CK     Radiology Reports CT ANGIO HEAD NECK W WO  CM  Result Date: 04/16/2021 CLINICAL DATA:  Acute neurologic deficit. EXAM: CT ANGIOGRAPHY HEAD AND NECK TECHNIQUE: Multidetector CT imaging of the head and neck was performed using the standard protocol during bolus administration of intravenous contrast. Multiplanar CT image  reconstructions and MIPs were obtained to evaluate the vascular anatomy. Carotid stenosis measurements (when applicable) are obtained utilizing NASCET criteria, using the distal internal carotid diameter as the denominator. CONTRAST:  75mL OMNIPAQUE IOHEXOL 350 MG/ML SOLN COMPARISON:  Brain MRI/MRA 04/16/2021 FINDINGS: CT HEAD FINDINGS Brain: No hemorrhage or extra-axial collection. Pericallosal lipoma again noted. The size and configuration of the ventricles and extra-axial CSF spaces are normal. There is no acute or chronic infarction. The brain parenchyma is normal. Skull: The visualized skull base, calvarium and extracranial soft tissues are normal. Sinuses/Orbits: No fluid levels or advanced mucosal thickening of the visualized paranasal sinuses. No mastoid or middle ear effusion. The orbits are normal. CTA NECK FINDINGS SKELETON: There is no bony spinal canal stenosis. No lytic or blastic lesion. OTHER NECK: Normal pharynx, larynx and major salivary glands. No cervical lymphadenopathy. Unremarkable thyroid gland. UPPER CHEST: No pneumothorax or pleural effusion. No nodules or masses. AORTIC ARCH: There is calcific atherosclerosis of the aortic arch. There is no aneurysm, dissection or hemodynamically significant stenosis of the visualized portion of the aorta. Conventional 3 vessel aortic branching pattern. The visualized proximal subclavian arteries are widely patent. RIGHT CAROTID SYSTEM: No dissection, occlusion or aneurysm. There is predominantly low density atherosclerosis extending into the proximal ICA, resulting in 50% stenosis. LEFT CAROTID SYSTEM: No dissection, occlusion or aneurysm. There is predominantly low density  atherosclerosis extending into the proximal ICA, resulting in less than 50% stenosis. VERTEBRAL ARTERIES: Codominant configuration. Both origins are clearly patent. There is no dissection, occlusion or flow-limiting stenosis to the skull base (V1-V3 segments). CTA HEAD FINDINGS POSTERIOR CIRCULATION: --Vertebral arteries: Bilateral atherosclerotic calcification with moderate stenosis, but both remain patent. --Inferior cerebellar arteries: Normal. --Basilar artery: Normal. --Superior cerebellar arteries: Normal. --Posterior cerebral arteries (PCA): Normal. ANTERIOR CIRCULATION: --Intracranial internal carotid arteries: Normal. --Anterior cerebral arteries (ACA): Normal. Both A1 segments are present. Patent anterior communicating artery (a-comm). --Middle cerebral arteries (MCA): Normal. VENOUS SINUSES: As permitted by contrast timing, patent. ANATOMIC VARIANTS: None Review of the MIP images confirms the above findings. IMPRESSION: 1. No emergent large vessel occlusion. 2. Bilateral proximal ICA plaque causing 50% stenosis on the right and no hemodynamically significant stenosis on the left. 3. Moderate bilateral vertebral artery V4 segment atherosclerotic calcification with moderate stenosis, but maintained patency. 4. Pericallosal lipoma, unchanged. Aortic atherosclerosis (ICD10-I70.0). Electronically Signed   By: Deatra RobinsonKevin  Herman M.D.   On: 04/16/2021 21:13   CT HEAD WO CONTRAST (5MM)  Result Date: 04/15/2021 CLINICAL DATA:  Neuro deficit, acute stroke suspected. Reported to be unable to see out of his left eye upon awakening today. EXAM: CT HEAD WITHOUT CONTRAST TECHNIQUE: Contiguous axial images were obtained from the base of the skull through the vertex without intravenous contrast. COMPARISON:  None. FINDINGS: Brain: There is no evidence of acute intracranial hemorrhage, mass lesion, brain edema or extra-axial fluid collection. The ventricles and subarachnoid spaces are appropriately sized for age. There is no  CT evidence of acute cortical infarction. There is a large curvilinear pericallosal lipoma, measuring up to 5.4 cm in length on image 31/6. Vascular: Intracranial vascular calcifications. No hyperdense vessel identified. Skull: Negative for fracture or focal lesion. Sinuses/Orbits: Probable small mucous retention cyst inferiorly in the left maxillary sinus. The additional visualized paranasal sinuses, mastoid air cells and middle ears are clear. Other: None. IMPRESSION: 1. No acute intracranial findings demonstrated. No CT evidence of acute stroke or hemorrhage. 2. Incidental large curvilinear pericallosal lipoma. Electronically Signed   By: Hilarie FredricksonWilliam  Veazey M.D.  On: 04/15/2021 13:01   MR ANGIO HEAD WO CONTRAST  Result Date: 04/16/2021 CLINICAL DATA:  Neuro deficit, acute, stroke suspected R vision loss; Neuro deficit, acute, stroke suspected EXAM: MRA NECK WITHOUT CONTRAST MRA HEAD WITHOUT CONTRAST TECHNIQUE: Angiographic images of the Circle of Willis were acquired using MRA technique without intravenous contrast. COMPARISON:  None. FINDINGS: MRA NECK FINDINGS Right carotid system: Limited evaluation proximally due to technique/artifact. The visible common carotid artery appear patent without significant stenosis. Limited evaluation at the carotid bifurcation on time-of-flight due to motion. On postcontrast imaging there appears to be moderate to severe stenosis of the proximal ICA. Left carotid system: Limited evaluation proximally due to artifact/technique. The visible common carotid artery appears patent without significant stenosis. At the carotid bifurcation there is probably mild stenosis. Vertebral arteries: Limited evaluation of the vertebral artery origins and proximal vertebral arteries due to artifact/technique. The visualized vertebral arteries are patent bilaterally without evidence of significant (greater than 50%) stenosis. MRA HEAD FINDINGS Anterior circulation: Bilateral intracranial ICAs,  MCAs and ACAs are patent without proximal hemodynamically significant stenosis. No aneurysm identified. Posterior circulation: Visualized intradural vertebral arteries, basilar artery, and posterior cerebral arteries are patent without proximal hemodynamically significant stenosis. Right posterior communicating artery noted. No aneurysm identified. IMPRESSION: MRA neck: 1. Limited study with suspected moderate to severe stenosis of the proximal right ICA. A CTA could provide more confident quantitative assessment if clinically indicated. 2. Probably mild stenosis of the left proximal ICA. MRA head: No large vessel occlusion or proximal hemodynamically significant stenosis. Electronically Signed   By: Feliberto Harts M.D.   On: 04/16/2021 15:27   MR Angiogram Neck W or Wo Contrast  Result Date: 04/16/2021 CLINICAL DATA:  Neuro deficit, acute, stroke suspected R vision loss; Neuro deficit, acute, stroke suspected EXAM: MRA NECK WITHOUT CONTRAST MRA HEAD WITHOUT CONTRAST TECHNIQUE: Angiographic images of the Circle of Willis were acquired using MRA technique without intravenous contrast. COMPARISON:  None. FINDINGS: MRA NECK FINDINGS Right carotid system: Limited evaluation proximally due to technique/artifact. The visible common carotid artery appear patent without significant stenosis. Limited evaluation at the carotid bifurcation on time-of-flight due to motion. On postcontrast imaging there appears to be moderate to severe stenosis of the proximal ICA. Left carotid system: Limited evaluation proximally due to artifact/technique. The visible common carotid artery appears patent without significant stenosis. At the carotid bifurcation there is probably mild stenosis. Vertebral arteries: Limited evaluation of the vertebral artery origins and proximal vertebral arteries due to artifact/technique. The visualized vertebral arteries are patent bilaterally without evidence of significant (greater than 50%) stenosis.  MRA HEAD FINDINGS Anterior circulation: Bilateral intracranial ICAs, MCAs and ACAs are patent without proximal hemodynamically significant stenosis. No aneurysm identified. Posterior circulation: Visualized intradural vertebral arteries, basilar artery, and posterior cerebral arteries are patent without proximal hemodynamically significant stenosis. Right posterior communicating artery noted. No aneurysm identified. IMPRESSION: MRA neck: 1. Limited study with suspected moderate to severe stenosis of the proximal right ICA. A CTA could provide more confident quantitative assessment if clinically indicated. 2. Probably mild stenosis of the left proximal ICA. MRA head: No large vessel occlusion or proximal hemodynamically significant stenosis. Electronically Signed   By: Feliberto Harts M.D.   On: 04/16/2021 15:27   MR BRAIN WO CONTRAST  Result Date: 04/16/2021 CLINICAL DATA:  Neuro deficit, acute, stroke suspected EXAM: MRI HEAD WITHOUT CONTRAST TECHNIQUE: Multiplanar, multiecho pulse sequences of the brain and surrounding structures were obtained without intravenous contrast. COMPARISON:  Same day CT head. FINDINGS: Brain:  Multiple small foci of restricted diffusion in the high right frontal cortex, suspicious for acute infarcts. Small areas of cortical T2 hyperintensity without restricted diffusion in the left parietal cortex, compatible with prior infarcts. Remote lacunar infarct in the left caudate. Scattered small T2/FLAIR hyperintensities in the white matter, nonspecific but compatible with mild chronic microvascular ischemic disease. Discrete T2 hyperintensities in the right greater than left basal ganglia are compatible with benign dilated perivascular spaces. No evidence of acute hemorrhage, hydrocephalus, mass lesion, midline shift or extra-axial fluid collection. Pericallosal lipoma, as seen on same day CT head. Cavum septum pellucidum et vergae, anatomic variant. Vascular: See forthcoming MRA. Skull  and upper cervical spine: Normal marrow signal. Sinuses/Orbits: Mild paranasal sinus mucosal thickening. Unremarkable orbits. Other: Trace mastoid effusions. IMPRESSION: 1. Multiple small foci of restricted diffusion in the high right frontal cortex, suspicious for acute infarcts. Mild associated edema without mass effect. 2. 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 3. 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 Electronically Signed   By: Feliberto Harts M.D.   On: 04/16/2021 14:40   ECHOCARDIOGRAM COMPLETE BUBBLE STUDY  Result Date: 04/16/2021    ECHOCARDIOGRAM REPORT   Patient Name:   BRENDA COWHER Date of Exam: 04/16/2021 Medical Rec #:  [id number]    Height: Accession #:    [id number]   Weight: Date of Birth:  02-Oct-1970    BSA: Patient Age:    50 years     BP:           118/84 mmHg Patient Gender: M            HR:           68 bpm. Exam Location:  Inpatient Procedure: 2D Echo, Cardiac Doppler, Color Doppler and Saline Contrast Bubble            Study Indications:    Stroke  History:        Patient has no prior history of Echocardiogram examinations.                 CAD; Prior CABG.  Sonographer:    Roosvelt Maser RDCS Referring Phys: [phone] Malachi Carl STACK IMPRESSIONS  1. Basal inferio/inferolateral hypokinesis. . Left ventricular ejection fraction, by estimation, is 50 to 55%. The left ventricle has low normal function. The left ventricular internal cavity size was moderately dilated.  2. Right ventricular systolic function is normal. The right ventricular size is normal.  3. The mitral valve is normal in structure. Trivial mitral valve regurgitation.  4. The aortic valve is normal in structure. Aortic valve regurgitation is not visualized. FINDINGS  Left Ventricle: Basal inferio/inferolateral hypokinesis. Left ventricular ejection fraction, by estimation, is 50 to 55%. The left ventricle has low normal function. The left ventricular internal  cavity size was moderately dilated. There is no left ventricular hypertrophy. Right Ventricle: The right ventricular size is normal. Right vetricular wall thickness was not assessed. Right ventricular systolic function is normal. Left Atrium: Left atrial size was normal in size. Right Atrium: Right atrial size was normal in size. Pericardium: There is no evidence of pericardial effusion. Mitral Valve: The mitral valve is normal in structure. Trivial mitral valve regurgitation. Tricuspid Valve: The tricuspid valve is normal in structure. Tricuspid valve regurgitation is trivial. Aortic Valve: The aortic valve is normal in structure. Aortic valve regurgitation is not visualized. Aortic valve mean gradient measures 3.0 mmHg. Aortic valve peak gradient measures 5.5 mmHg. Aortic valve area, by  VTI measures 1.90 cm. Pulmonic Valve: The pulmonic valve was normal in structure. Pulmonic valve regurgitation is not visualized. Aorta: The aortic root is normal in size and structure. IAS/Shunts: No atrial level shunt detected by color flow Doppler. Agitated saline contrast was given intravenously to evaluate for intracardiac shunting.  LEFT VENTRICLE PLAX 2D LVIDd:         5.60 cm   Diastology LVIDs:         4.00 cm   LV e' medial:    8.38 cm/s LV PW:         0.90 cm   LV E/e' medial:  10.1 LV IVS:        0.70 cm   LV e' lateral:   10.20 cm/s LVOT diam:     2.00 cm   LV E/e' lateral: 8.3 LV SV:         43 LVOT Area:     3.14 cm  RIGHT VENTRICLE RV Basal diam:  3.40 cm RV S prime:     8.27 cm/s TAPSE (M-mode): 1.4 cm LEFT ATRIUM             RIGHT ATRIUM LA diam:        4.30 cm RA Area:     13.00 cm LA Vol (A2C):   35.1 ml RA Volume:   30.70 ml LA Vol (A4C):   26.8 ml LA Biplane Vol: 30.6 ml  AORTIC VALVE AV Area (Vmax):    1.74 cm AV Area (Vmean):   1.66 cm AV Area (VTI):     1.90 cm AV Vmax:           117.00 cm/s AV Vmean:          78.700 cm/s AV VTI:            0.226 m AV Peak Grad:      5.5 mmHg AV Mean Grad:      3.0  mmHg LVOT Vmax:         64.70 cm/s LVOT Vmean:        41.600 cm/s LVOT VTI:          0.137 m LVOT/AV VTI ratio: 0.61  AORTA Ao Root diam: 3.30 cm Ao Asc diam:  3.30 cm MITRAL VALVE MV Area (PHT): 4.80 cm    SHUNTS MV Decel Time: 158 msec    Systemic VTI:  0.14 m MV E velocity: 84.80 cm/s  Systemic Diam: 2.00 cm MV A velocity: 65.60 cm/s MV E/A ratio:  1.29 Dietrich PatesPaula Ross MD Electronically signed by Dietrich PatesPaula Ross MD Signature Date/Time: 04/16/2021/2:11:04 PM    Final

## 2021-04-18 ENCOUNTER — Encounter (HOSPITAL_COMMUNITY): Payer: Self-pay | Admitting: Internal Medicine

## 2021-04-18 DIAGNOSIS — I63231 Cerebral infarction due to unspecified occlusion or stenosis of right carotid arteries: Secondary | ICD-10-CM

## 2021-04-18 LAB — GLUCOSE, CAPILLARY
Glucose-Capillary: 160 mg/dL — ABNORMAL HIGH (ref 70–99)
Glucose-Capillary: 146 mg/dL — ABNORMAL HIGH (ref 70–99)
Glucose-Capillary: 354 mg/dL — ABNORMAL HIGH (ref 70–99)
Glucose-Capillary: 133 mg/dL — ABNORMAL HIGH (ref 70–99)

## 2021-04-18 LAB — TYPE AND SCREEN
ABO/RH(D): O NEG
Antibody Screen: NEGATIVE

## 2021-04-18 LAB — SURGICAL PCR SCREEN
MRSA, PCR: NEGATIVE
Staphylococcus aureus: POSITIVE — AB

## 2021-04-18 LAB — ABO/RH: ABO/RH(D): O NEG

## 2021-04-18 MED ORDER — CHLORHEXIDINE GLUCONATE CLOTH 2 % EX PADS
6.0000 | MEDICATED_PAD | Freq: Every day | CUTANEOUS | Status: DC
  Administered 2021-04-20: 6 via TOPICAL

## 2021-04-18 MED ORDER — METOPROLOL TARTRATE 5 MG/5ML IV SOLN: 5.0000 mg | Freq: Three times a day (TID) | INTRAVENOUS | Status: DC | PRN

## 2021-04-18 MED ORDER — MUPIROCIN 2 % EX OINT
1.0000 "application " | TOPICAL_OINTMENT | Freq: Two times a day (BID) | CUTANEOUS | Status: DC
  Administered 2021-04-19 – 2021-04-20 (×3): 1 via NASAL
  Filled 2021-04-18 (×2): qty 22

## 2021-04-18 NOTE — Progress Notes (Signed)
°  Progress Note    04/18/2021 7:10 PM * No surgery date entered *  51 year old male with symptomatic right ICA stenosis. He reports no new symptoms overnight. CEA vs Carotid Stenting was discussed with him in full detail yesterday by Dr. Karin Lieu and decision was made to proceed with right CEA. He has no further questions today. He will be NPO after midnight. Consent in chart. He is scheduled for right CEA tomorrow with Dr. Karin Lieu.    Victorino Sparrow, PA-C [facility] [phone] 04/18/2021 7:10 PM  VASCULAR STAFF ADDENDUM: I have independently interviewed and examined the patient. I agree with the above.    Fara Olden, MD [facility] Phone Number: [phone] 04/18/2021 7:10 PM

## 2021-04-18 NOTE — Progress Notes (Signed)
PROGRESS NOTE                                                                                                                                                                                                             Patient Demographics:    Martin Lee, is a 51 y.o. male, DOB - 24-Jun-1970, [id number]  Outpatient Primary MD for the patient is Adaline Sill, NP    LOS - 2  Admit date - 04/16/2021    Chief Complaint  Patient presents with   abnormal CT       Brief Narrative (HPI from H&P)     Martin Lee is a 51 y.o. male with medical history significant of CAD s/p CABG x4 in 2018, DM type II, SBO, and tobacco abuse presents with complaints of vision change of his right eye which started yesterday morning when he woke up, initially came to the ER but left before ER MD could see him, he subsequently came back to the hospital where he was diagnosed with right ICA stenosis, right frontal lobe stroke and right retinal artery occlusion.   Subjective:   Patient in bed, appears comfortable, denies any headache, no fever, no chest pain or pressure, no shortness of breath , no abdominal pain. No new focal weakness.  Still extremely blurry vision out of the right eye.   Assessment  & Plan :     Acute right eye vision loss-due to acute right central artery occlusion - in a patient with history of hypertension, ongoing nicotine abuse, MRI evidence of right frontal CVA, right ICA stenosis.  He has been seen by ophthalmology in the office already, neurology on board, currently on aspirin Plavix and statin for secondary prevention.  Stroke work-up per stroke team.  Vascular surgery on board as well for right ICA stenosis.Marland Kitchen  2.  Right ICA stenosis.  Due for surgical correction on 04/19/2021,.  3.  Hypertension.  Allow for permissive hypertension due to right frontal CVA.  4.  DM type II.  Sliding scale insulin, continue home  regimen upon discharge.  5.  Dyslipidemia.  On statin with satisfactory LDL.  6.  ISmoking.  Counseled to quit smoking.  7. CAD with CABG.  Chest pain-free and nonacute EKG, already on aspirin beta-blocker and statin at home for secondary prevention which will be continued, strictly  counseled to stop smoking.  Outpatient cardiology follow-up.  Global hypokinesis likely chronic.  No acute chest symptoms.  EKG is nonacute.  Patient is moderate to high risk for adverse cardiopulmonary outcome due to his underlying history of CAD and ongoing smoking, he does walk 7-10 blocks a day at work, can climb 2 flights of stairs without any chest pain or shortness of breath.  Cannot use beta-blocker routinely due to acute stroke but as needed beta-blocker ordered, continue dual antiplatelet therapy and statin for secondary prevention.  Echo noted.  Cardiology to also evaluate him prior to surgery.  Patient and wife understand the risk and want to proceed with surgery.    Lab Results  Component Value Date   HGBA1C 7.4 (H) 04/16/2021    Lab Results  Component Value Date   CHOL 117 04/17/2021   HDL 26 (L) 04/17/2021   LDLCALC 52 04/17/2021   LDLDIRECT 76.5 04/16/2021   TRIG 195 (H) 04/17/2021   CHOLHDL 4.5 04/17/2021         Condition - Fair  Family Communication  :  None present  Code Status :  Full  Consults  :  Neuro, VVS  PUD Prophylaxis :    Procedures  :     MRI -  1. Multiple small foci of restricted diffusion in the high right frontal cortex, suspicious for acute infarcts. Mild associated edema without mass effect. 2. 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 3. 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  MRA - MRA neck: 1. Limited study with suspected moderate to severe stenosis of the proximal right ICA. A CTA could provide more confident quantitative assessment if clinically indicated. 2. Probably mild stenosis  of the left proximal ICA. MRA head: No large vessel occlusion or proximal hemodynamically significant stenosis.  CTA - 1. No emergent large vessel occlusion. 2. Bilateral proximal ICA plaque causing 50% stenosis on the right and no hemodynamically significant stenosis on the left. 3. Moderate bilateral vertebral artery V4 segment atherosclerotic calcification with moderate stenosis, but maintained patency. 4. Pericallosal lipoma, unchanged. Aortic atherosclerosis   TTE - 1. Basal inferio/inferolateral hypokinesis. . Left ventricular ejection fraction, by estimation, is 50 to 55%. The left ventricle has low normal function. The left ventricular internal cavity size was moderately dilated.  2. Right ventricular systolic function is normal. The right ventricular size is normal.  3. The mitral valve is normal in structure. Trivial mitral valve regurgitation.  4. The aortic valve is normal in structure. Aortic valve regurgitation is not visualized.       Disposition Plan  :    Status is: Inpatient  Remains inpatient appropriate because: R eye vision loss  DVT Prophylaxis  :    enoxaparin (LOVENOX) injection 40 mg Start: 04/16/21 1600    Lab Results  Component Value Date   PLT 181 04/17/2021    Diet :  Diet Order             Diet NPO time specified  Diet effective midnight           Diet Heart Room service appropriate? Yes; Fluid consistency: Thin  Diet effective now                    Inpatient Medications  Scheduled Meds:  aspirin  81 mg Oral Daily   atorvastatin  40 mg Oral Daily   clopidogrel  75 mg Oral Daily   enoxaparin (LOVENOX) injection  40 mg Subcutaneous Q24H   insulin aspart  0-9 Units Subcutaneous TID WC   nicotine  21 mg Transdermal Daily   Continuous Infusions: PRN Meds:.acetaminophen **OR** acetaminophen (TYLENOL) oral liquid 160 mg/5 mL **OR** acetaminophen, metoprolol tartrate  Antibiotics  :    Anti-infectives (From admission, onward)    None         Time Spent in minutes  30   Lala Lund M.D on 04/18/2021 at 11:46 AM  To page go to [email or web address]   [facility] -  Office  [phone]  See all Orders from today for further details    Objective:   Vitals:   04/17/21 1935 04/17/21 2355 04/18/21 0355 04/18/21 0827  BP: (!) 137/91 124/88 123/87 (!) 124/92  Pulse: 76 79 72 84  Resp: 18 16 18 18   Temp: 98.7 F (37.1 C) 97.9 F (36.6 C) 98 F (36.7 C)   TempSrc: Oral Oral Oral   SpO2: 97% 96% 96% 98%  Weight:      Height:        Wt Readings from Last 3 Encounters:  04/17/21 81.3 kg     Intake/Output Summary (Last 24 hours) at 04/18/2021 1146 Last data filed at 04/18/2021 1100 Gross per 24 hour  Intake 820 ml  Output --  Net 820 ml     Physical Exam  Awake Alert, No new F.N deficits, right eye vision loss [place].AT,PERRAL Supple Neck, No JVD,   Symmetrical Chest wall movement, Good air movement bilaterally, CTAB RRR,No Gallops, Rubs or new Murmurs,  +ve B.Sounds, Abd Soft, No tenderness,   No Cyanosis, Clubbing or edema       Data Review:    CBC Recent Labs  Lab 04/15/21 1252 04/15/21 1351 04/16/21 1037 04/17/21 0552  WBC 10.3  --  10.6* 9.9  HGB 17.3* 17.7* 17.8* 16.9  HCT 51.4 52.0 51.0 49.0  PLT 195  --  194 181  MCV 97.9  --  96.2 96.3  MCH 33.0  --  33.6 33.2  MCHC 33.7  --  34.9 34.5  RDW 12.3  --  12.3 12.4  LYMPHSABS 3.4  --   --   --   MONOABS 0.9  --   --   --   EOSABS 0.1  --   --   --   BASOSABS 0.0  --   --   --     Electrolytes Recent Labs  Lab 04/15/21 1252 04/15/21 1351 04/16/21 1037 04/16/21 1149 04/17/21 0552 04/17/21 0758  NA 136 139 138  --  135  --   K 4.6 4.4 5.0  --  4.6  --   CL 103 105 106  --  106  --   CO2 25  --  23  --  20*  --   GLUCOSE 119* 114* 139*  --  165*  --   BUN 10 12 11   --  11  --   CREATININE 0.76 0.70 0.78  --  0.94  --   CALCIUM 9.3  --  9.5  --  8.9  --   AST 32  --   --   --   --   --   ALT 30  --   --   --   --   --    ALKPHOS 96  --   --   --   --   --   BILITOT 0.9  --   --   --   --   --  ALBUMIN 4.1  --   --   --   --   --   CRP  --   --   --   --   --  <0.5  INR 1.0  --   --   --   --   --   HGBA1C  --   --   --  7.4*  --   --     ------------------------------------------------------------------------------------------------------------------ Recent Labs    04/16/21 1149 04/17/21 0819  CHOL  --  117  HDL  --  26*  LDLCALC  --  52  TRIG  --  195*  CHOLHDL  --  4.5  LDLDIRECT 76.5  --     Lab Results  Component Value Date   HGBA1C 7.4 (H) 04/16/2021    No results for input(s): TSH, T4TOTAL, T3FREE, THYROIDAB in the last 72 hours.  Invalid input(s): FREET3 ------------------------------------------------------------------------------------------------------------------ ID Labs Recent Labs  Lab 04/15/21 1252 04/15/21 1351 04/16/21 1037 04/17/21 0552 04/17/21 0758  WBC 10.3  --  10.6* 9.9  --   PLT 195  --  194 181  --   CRP  --   --   --   --  <0.5  CREATININE 0.76 0.70 0.78 0.94  --    Cardiac Enzymes No results for input(s): CKMB, TROPONINI, MYOGLOBIN in the last 168 hours.  Invalid input(s): CK     Radiology Reports CT ANGIO HEAD NECK W WO CM  Result Date: 04/16/2021 CLINICAL DATA:  Acute neurologic deficit. EXAM: CT ANGIOGRAPHY HEAD AND NECK TECHNIQUE: Multidetector CT imaging of the head and neck was performed using the standard protocol during bolus administration of intravenous contrast. Multiplanar CT image reconstructions and MIPs were obtained to evaluate the vascular anatomy. Carotid stenosis measurements (when applicable) are obtained utilizing NASCET criteria, using the distal internal carotid diameter as the denominator. CONTRAST:  52mL OMNIPAQUE IOHEXOL 350 MG/ML SOLN COMPARISON:  Brain MRI/MRA 04/16/2021 FINDINGS: CT HEAD FINDINGS Brain: No hemorrhage or extra-axial collection. Pericallosal lipoma again noted. The size and configuration of the ventricles and  extra-axial CSF spaces are normal. There is no acute or chronic infarction. The brain parenchyma is normal. Skull: The visualized skull base, calvarium and extracranial soft tissues are normal. Sinuses/Orbits: No fluid levels or advanced mucosal thickening of the visualized paranasal sinuses. No mastoid or middle ear effusion. The orbits are normal. CTA NECK FINDINGS SKELETON: There is no bony spinal canal stenosis. No lytic or blastic lesion. OTHER NECK: Normal pharynx, larynx and major salivary glands. No cervical lymphadenopathy. Unremarkable thyroid gland. UPPER CHEST: No pneumothorax or pleural effusion. No nodules or masses. AORTIC ARCH: There is calcific atherosclerosis of the aortic arch. There is no aneurysm, dissection or hemodynamically significant stenosis of the visualized portion of the aorta. Conventional 3 vessel aortic branching pattern. The visualized proximal subclavian arteries are widely patent. RIGHT CAROTID SYSTEM: No dissection, occlusion or aneurysm. There is predominantly low density atherosclerosis extending into the proximal ICA, resulting in 50% stenosis. LEFT CAROTID SYSTEM: No dissection, occlusion or aneurysm. There is predominantly low density atherosclerosis extending into the proximal ICA, resulting in less than 50% stenosis. VERTEBRAL ARTERIES: Codominant configuration. Both origins are clearly patent. There is no dissection, occlusion or flow-limiting stenosis to the skull base (V1-V3 segments). CTA HEAD FINDINGS POSTERIOR CIRCULATION: --Vertebral arteries: Bilateral atherosclerotic calcification with moderate stenosis, but both remain patent. --Inferior cerebellar arteries: Normal. --Basilar artery: Normal. --Superior cerebellar arteries: Normal. --Posterior cerebral arteries (PCA): Normal. ANTERIOR CIRCULATION: --Intracranial internal carotid arteries:  Normal. --Anterior cerebral arteries (ACA): Normal. Both A1 segments are present. Patent anterior communicating artery  (a-comm). --Middle cerebral arteries (MCA): Normal. VENOUS SINUSES: As permitted by contrast timing, patent. ANATOMIC VARIANTS: None Review of the MIP images confirms the above findings. IMPRESSION: 1. No emergent large vessel occlusion. 2. Bilateral proximal ICA plaque causing 50% stenosis on the right and no hemodynamically significant stenosis on the left. 3. Moderate bilateral vertebral artery V4 segment atherosclerotic calcification with moderate stenosis, but maintained patency. 4. Pericallosal lipoma, unchanged. Aortic atherosclerosis (ICD10-I70.0). Electronically Signed   By: Ulyses Jarred M.D.   On: 04/16/2021 21:13   CT HEAD WO CONTRAST (5MM)  Result Date: 04/15/2021 CLINICAL DATA:  Neuro deficit, acute stroke suspected. Reported to be unable to see out of his left eye upon awakening today. EXAM: CT HEAD WITHOUT CONTRAST TECHNIQUE: Contiguous axial images were obtained from the base of the skull through the vertex without intravenous contrast. COMPARISON:  None. FINDINGS: Brain: There is no evidence of acute intracranial hemorrhage, mass lesion, brain edema or extra-axial fluid collection. The ventricles and subarachnoid spaces are appropriately sized for age. There is no CT evidence of acute cortical infarction. There is a large curvilinear pericallosal lipoma, measuring up to 5.4 cm in length on image 31/6. Vascular: Intracranial vascular calcifications. No hyperdense vessel identified. Skull: Negative for fracture or focal lesion. Sinuses/Orbits: Probable small mucous retention cyst inferiorly in the left maxillary sinus. The additional visualized paranasal sinuses, mastoid air cells and middle ears are clear. Other: None. IMPRESSION: 1. No acute intracranial findings demonstrated. No CT evidence of acute stroke or hemorrhage. 2. Incidental large curvilinear pericallosal lipoma. Electronically Signed   By: Richardean Sale M.D.   On: 04/15/2021 13:01   MR ANGIO HEAD WO CONTRAST  Result Date:  04/16/2021 CLINICAL DATA:  Neuro deficit, acute, stroke suspected R vision loss; Neuro deficit, acute, stroke suspected EXAM: MRA NECK WITHOUT CONTRAST MRA HEAD WITHOUT CONTRAST TECHNIQUE: Angiographic images of the Circle of Willis were acquired using MRA technique without intravenous contrast. COMPARISON:  None. FINDINGS: MRA NECK FINDINGS Right carotid system: Limited evaluation proximally due to technique/artifact. The visible common carotid artery appear patent without significant stenosis. Limited evaluation at the carotid bifurcation on time-of-flight due to motion. On postcontrast imaging there appears to be moderate to severe stenosis of the proximal ICA. Left carotid system: Limited evaluation proximally due to artifact/technique. The visible common carotid artery appears patent without significant stenosis. At the carotid bifurcation there is probably mild stenosis. Vertebral arteries: Limited evaluation of the vertebral artery origins and proximal vertebral arteries due to artifact/technique. The visualized vertebral arteries are patent bilaterally without evidence of significant (greater than 50%) stenosis. MRA HEAD FINDINGS Anterior circulation: Bilateral intracranial ICAs, MCAs and ACAs are patent without proximal hemodynamically significant stenosis. No aneurysm identified. Posterior circulation: Visualized intradural vertebral arteries, basilar artery, and posterior cerebral arteries are patent without proximal hemodynamically significant stenosis. Right posterior communicating artery noted. No aneurysm identified. IMPRESSION: MRA neck: 1. Limited study with suspected moderate to severe stenosis of the proximal right ICA. A CTA could provide more confident quantitative assessment if clinically indicated. 2. Probably mild stenosis of the left proximal ICA. MRA head: No large vessel occlusion or proximal hemodynamically significant stenosis. Electronically Signed   By: Margaretha Sheffield M.D.   On:  04/16/2021 15:27   MR Angiogram Neck W or Wo Contrast  Result Date: 04/16/2021 CLINICAL DATA:  Neuro deficit, acute, stroke suspected R vision loss; Neuro deficit, acute, stroke suspected EXAM: MRA  NECK WITHOUT CONTRAST MRA HEAD WITHOUT CONTRAST TECHNIQUE: Angiographic images of the Circle of Willis were acquired using MRA technique without intravenous contrast. COMPARISON:  None. FINDINGS: MRA NECK FINDINGS Right carotid system: Limited evaluation proximally due to technique/artifact. The visible common carotid artery appear patent without significant stenosis. Limited evaluation at the carotid bifurcation on time-of-flight due to motion. On postcontrast imaging there appears to be moderate to severe stenosis of the proximal ICA. Left carotid system: Limited evaluation proximally due to artifact/technique. The visible common carotid artery appears patent without significant stenosis. At the carotid bifurcation there is probably mild stenosis. Vertebral arteries: Limited evaluation of the vertebral artery origins and proximal vertebral arteries due to artifact/technique. The visualized vertebral arteries are patent bilaterally without evidence of significant (greater than 50%) stenosis. MRA HEAD FINDINGS Anterior circulation: Bilateral intracranial ICAs, MCAs and ACAs are patent without proximal hemodynamically significant stenosis. No aneurysm identified. Posterior circulation: Visualized intradural vertebral arteries, basilar artery, and posterior cerebral arteries are patent without proximal hemodynamically significant stenosis. Right posterior communicating artery noted. No aneurysm identified. IMPRESSION: MRA neck: 1. Limited study with suspected moderate to severe stenosis of the proximal right ICA. A CTA could provide more confident quantitative assessment if clinically indicated. 2. Probably mild stenosis of the left proximal ICA. MRA head: No large vessel occlusion or proximal hemodynamically significant  stenosis. Electronically Signed   By: Margaretha Sheffield M.D.   On: 04/16/2021 15:27   MR BRAIN WO CONTRAST  Result Date: 04/16/2021 CLINICAL DATA:  Neuro deficit, acute, stroke suspected EXAM: MRI HEAD WITHOUT CONTRAST TECHNIQUE: Multiplanar, multiecho pulse sequences of the brain and surrounding structures were obtained without intravenous contrast. COMPARISON:  Same day CT head. FINDINGS: Brain: Multiple small foci of restricted diffusion in the high right frontal cortex, suspicious for acute infarcts. Small areas of cortical T2 hyperintensity without restricted diffusion in the left parietal cortex, compatible with prior infarcts. Remote lacunar infarct in the left caudate. Scattered small T2/FLAIR hyperintensities in the white matter, nonspecific but compatible with mild chronic microvascular ischemic disease. Discrete T2 hyperintensities in the right greater than left basal ganglia are compatible with benign dilated perivascular spaces. No evidence of acute hemorrhage, hydrocephalus, mass lesion, midline shift or extra-axial fluid collection. Pericallosal lipoma, as seen on same day CT head. Cavum septum pellucidum et vergae, anatomic variant. Vascular: See forthcoming MRA. Skull and upper cervical spine: Normal marrow signal. Sinuses/Orbits: Mild paranasal sinus mucosal thickening. Unremarkable orbits. Other: Trace mastoid effusions. IMPRESSION: 1. Multiple small foci of restricted diffusion in the high right frontal cortex, suspicious for acute infarcts. Mild associated edema without mass effect. 2. 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 3. 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 Electronically Signed   By: Margaretha Sheffield M.D.   On: 04/16/2021 14:40   ECHOCARDIOGRAM COMPLETE BUBBLE STUDY  Result Date: 04/16/2021    ECHOCARDIOGRAM REPORT   Patient Name:   Martin Lee Date of Exam: 04/16/2021 Medical Rec #:  [id number]    Height:  Accession #:    [id number]   Weight: Date of Birth:  03-29-1971    BSA: Patient Age:    28 years     BP:           118/84 mmHg Patient Gender: M            HR:           68 bpm. Exam Location:  Inpatient Procedure: 2D Echo, Cardiac Doppler, Color Doppler and  Saline Contrast Bubble            Study Indications:    Stroke  History:        Patient has no prior history of Echocardiogram examinations.                 CAD; Prior CABG.  Sonographer:    Merrie Roof RDCS Referring Phys: [place]  1. Basal inferio/inferolateral hypokinesis. . Left ventricular ejection fraction, by estimation, is 50 to 55%. The left ventricle has low normal function. The left ventricular internal cavity size was moderately dilated.  2. Right ventricular systolic function is normal. The right ventricular size is normal.  3. The mitral valve is normal in structure. Trivial mitral valve regurgitation.  4. The aortic valve is normal in structure. Aortic valve regurgitation is not visualized. FINDINGS  Left Ventricle: Basal inferio/inferolateral hypokinesis. Left ventricular ejection fraction, by estimation, is 50 to 55%. The left ventricle has low normal function. The left ventricular internal cavity size was moderately dilated. There is no left ventricular hypertrophy. Right Ventricle: The right ventricular size is normal. Right vetricular wall thickness was not assessed. Right ventricular systolic function is normal. Left Atrium: Left atrial size was normal in size. Right Atrium: Right atrial size was normal in size. Pericardium: There is no evidence of pericardial effusion. Mitral Valve: The mitral valve is normal in structure. Trivial mitral valve regurgitation. Tricuspid Valve: The tricuspid valve is normal in structure. Tricuspid valve regurgitation is trivial. Aortic Valve: The aortic valve is normal in structure. Aortic valve regurgitation is not visualized. Aortic valve mean gradient measures 3.0 mmHg. Aortic  valve peak gradient measures 5.5 mmHg. Aortic valve area, by VTI measures 1.90 cm. Pulmonic Valve: The pulmonic valve was normal in structure. Pulmonic valve regurgitation is not visualized. Aorta: The aortic root is normal in size and structure. IAS/Shunts: No atrial level shunt detected by color flow Doppler. Agitated saline contrast was given intravenously to evaluate for intracardiac shunting.  LEFT VENTRICLE PLAX 2D LVIDd:         5.60 cm   Diastology LVIDs:         4.00 cm   LV e' medial:    8.38 cm/s LV PW:         0.90 cm   LV E/e' medial:  10.1 LV IVS:        0.70 cm   LV e' lateral:   10.20 cm/s LVOT diam:     2.00 cm   LV E/e' lateral: 8.3 LV SV:         43 LVOT Area:     3.14 cm  RIGHT VENTRICLE RV Basal diam:  3.40 cm RV S prime:     8.27 cm/s TAPSE (M-mode): 1.4 cm LEFT ATRIUM             RIGHT ATRIUM LA diam:        4.30 cm RA Area:     13.00 cm LA Vol (A2C):   35.1 ml RA Volume:   30.70 ml LA Vol (A4C):   26.8 ml LA Biplane Vol: 30.6 ml  AORTIC VALVE AV Area (Vmax):    1.74 cm AV Area (Vmean):   1.66 cm AV Area (VTI):     1.90 cm AV Vmax:           117.00 cm/s AV Vmean:          78.700 cm/s AV VTI:  0.226 m AV Peak Grad:      5.5 mmHg AV Mean Grad:      3.0 mmHg LVOT Vmax:         64.70 cm/s LVOT Vmean:        41.600 cm/s LVOT VTI:          0.137 m LVOT/AV VTI ratio: 0.61  AORTA Ao Root diam: 3.30 cm Ao Asc diam:  3.30 cm MITRAL VALVE MV Area (PHT): 4.80 cm    SHUNTS MV Decel Time: 158 msec    Systemic VTI:  0.14 m MV E velocity: 84.80 cm/s  Systemic Diam: 2.00 cm MV A velocity: 65.60 cm/s MV E/A ratio:  1.29 Dorris Carnes MD Electronically signed by Dorris Carnes MD Signature Date/Time: 04/16/2021/2:11:04 PM    Final    VAS US CAROTID  Result Date: 04/17/2021 Carotid Arterial Duplex Study Patient Name:  Martin Lee  Date of Exam:   04/17/2021 Medical Rec #: [id number]     Accession #:    [id number] Date of Birth: January 15, 1971     Patient Gender: M Patient Age:   72 years Exam Location:  [facility] Procedure:      VAS US CAROTID Referring Phys: Vonna Kotyk ROBINS --------------------------------------------------------------------------------  Indications:       CVA, Visual disturbance and right retinal artery occlusion. Risk Factors:      Hypertension, hyperlipidemia, Diabetes, current smoker,                    coronary artery disease (CABG 2018). Other Factors:     50% stenosis of the right ICA noted on CTA. Comparison Study:  No prior study Performing Technologist: Sharion Dove RVS  Examination Guidelines: A complete evaluation includes B-mode imaging, spectral Doppler, color Doppler, and power Doppler as needed of all accessible portions of each vessel. Bilateral testing is considered an integral part of a complete examination. Limited examinations for reoccurring indications may be performed as noted.  Right Carotid Findings: +----------+--------+--------+--------+------------------+------------------+             PSV cm/s EDV cm/s Stenosis Plaque Description Comments            +----------+--------+--------+--------+------------------+------------------+  CCA Prox   76       21                                   intimal thickening  +----------+--------+--------+--------+------------------+------------------+  CCA Distal 69       20                                   intimal thickening  +----------+--------+--------+--------+------------------+------------------+  ICA Prox   152      69       40-59%   heterogenous                           +----------+--------+--------+--------+------------------+------------------+  ICA Mid    156      57                                                       +----------+--------+--------+--------+------------------+------------------+  ICA Distal 151  54                                                       +----------+--------+--------+--------+------------------+------------------+  ECA        102      19                                                        +----------+--------+--------+--------+------------------+------------------+ +----------+--------+-------+--------+-------------------+             PSV cm/s EDV cms Describe Arm Pressure (mmHG)  +----------+--------+-------+--------+-------------------+  Subclavian 147                                            +----------+--------+-------+--------+-------------------+ +---------+--------+--+--------+--+  Vertebral PSV cm/s 62 EDV cm/s 20  +---------+--------+--+--------+--+  Left Carotid Findings: +----------+--------+--------+--------+------------------+------------------+             PSV cm/s EDV cm/s Stenosis Plaque Description Comments            +----------+--------+--------+--------+------------------+------------------+  CCA Prox   63       16                focal and calcific                     +----------+--------+--------+--------+------------------+------------------+  CCA Distal 81       26                                   intimal thickening  +----------+--------+--------+--------+------------------+------------------+  ICA Prox   47       19       1-39%    heterogenous                           +----------+--------+--------+--------+------------------+------------------+  ICA Mid    72       25                                                       +----------+--------+--------+--------+------------------+------------------+  ECA        63       14                homogeneous                            +----------+--------+--------+--------+------------------+------------------+ +----------+--------+--------+--------+-------------------+             PSV cm/s EDV cm/s Describe Arm Pressure (mmHG)  +----------+--------+--------+--------+-------------------+  Subclavian 115                                             +----------+--------+--------+--------+-------------------+ +---------+--------+--+--------+--+  Vertebral PSV cm/s 35 EDV cm/s 10  +---------+--------+--+--------+--+   Summary: Right  Carotid: Velocities in the right ICA are consistent with a 40-59%                stenosis. Left Carotid: Velocities in the left ICA are consistent with a 1-39% stenosis. Vertebrals:  Bilateral vertebral arteries demonstrate antegrade flow. Subclavians: Normal flow hemodynamics were seen in bilateral subclavian              arteries. *See table(s) above for measurements and observations.     Preliminary

## 2021-04-18 NOTE — Consult Note (Addendum)
CARDIOLOGY CONSULT NOTE  Patient ID: Martin Lee MRN: [id number] DOB/AGE: 1970-09-10 51 y.o.  Admit date: 04/16/2021 Referring Physician: [facility] hospitalist Reason for Consultation:  Cardiac risk stratification  HPI:   51 y.o. Caucasian male  with hypertension, type 2 DM, mixed hyperlipidemia, CAD s/p CABGX4 (2018), h/o SBO, tobacco dependence, admitted with acute Rt CRAO. He was found to have moderate Rt ICA stenosis for which Rt endarterectomy was recommended. Cardiology consulted for perioperative risk stratification.   Patient was admitted on 04/16/2021 after an episode of acute right vision loss, was found to have right central artery artery occlusion.  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  He stays active at his job, with a lot of walking, lifting etc.  He does not have any chest pain, shortness of breath or any other symptoms.  He underwent CABG in 2018 (LIMA-LAD, rSVG-OM1, OM 2, dRCA )at [facility] after symptoms of dyspnea.  He has not had any dyspnea symptoms since then.  He is compliant with his medical therapy, and has very well-controlled hypertension, hyperlipidemia, and fairly well controlled type 2 diabetes mellitus.   Past Medical History:  Diagnosis Date   Coronary artery disease    Diabetes mellitus without complication ([place])      Past Surgical History:  Procedure Laterality Date   CORONARY ARTERY BYPASS GRAFT  2018     History reviewed. No pertinent family history.   Social History: Social History   Socioeconomic History   Marital status: Married    Spouse name: Not on file   Number of children: Not on file   Years of education: Not on file   Highest education level: Not on file  Occupational History   Not on file  Tobacco Use   Smoking status: Every Day    Types: Cigarettes   Smokeless tobacco: Never  Vaping Use   Vaping Use: Never used  Substance and Sexual Activity   Alcohol use: Yes   Drug use: Not Currently   Sexual  activity: Not on file  Other Topics Concern   Not on file  Social History Narrative   Not on file   Social Determinants of Health   Financial Resource Strain: Not on file  Food Insecurity: Not on file  Transportation Needs: Not on file  Physical Activity: Not on file  Stress: Not on file  Social Connections: Not on file  Intimate Partner Violence: Not on file     Medications Prior to Admission  Medication Sig Dispense Refill Last Dose   ALPRAZolam (XANAX) 1 MG tablet Take 1 mg by mouth 2 (two) times daily.   04/16/2021   aspirin 325 MG EC tablet Take 325 mg by mouth in the morning.   04/16/2021   atorvastatin (LIPITOR) 40 MG tablet Take 40 mg by mouth every evening.   04/15/2021   empagliflozin (JARDIANCE) 25 MG TABS tablet Take 25 mg by mouth daily.   04/16/2021   escitalopram (LEXAPRO) 10 MG tablet Take 10 mg by mouth daily.   04/16/2021   metFORMIN (GLUCOPHAGE) 1000 MG tablet Take 1,000 mg by mouth 2 (two) times daily with a meal.   04/16/2021   metoprolol tartrate (LOPRESSOR) 50 MG tablet Take 50 mg by mouth 2 (two) times daily.   04/16/2021 at 0830    Review of Systems  Constitutional: Negative for decreased appetite, malaise/fatigue, weight gain and weight loss.  HENT:  Negative for congestion.   Eyes:  Positive for vision loss in right eye.  Negative for visual disturbance.  Cardiovascular:  Negative for chest pain, dyspnea on exertion, leg swelling, palpitations and syncope.  Respiratory:  Negative for cough.   Endocrine: Negative for cold intolerance.  Hematologic/Lymphatic: Does not bruise/bleed easily.  Skin:  Negative for itching and rash.  Musculoskeletal:  Negative for myalgias.  Gastrointestinal:  Negative for abdominal pain, nausea and vomiting.  Genitourinary:  Negative for dysuria.  Neurological:  Negative for dizziness and weakness.  Psychiatric/Behavioral:  The patient is not nervous/anxious.   All other systems reviewed and are negative.    Physical Exam: Physical  Exam Vitals and nursing note reviewed.  Constitutional:      General: He is not in acute distress.    Appearance: He is well-developed.  HENT:     Head: Normocephalic and atraumatic.  Eyes:     Conjunctiva/sclera: Conjunctivae normal.     Pupils: Pupils are equal, round, and reactive to light.  Neck:     Vascular: No JVD.  Cardiovascular:     Rate and Rhythm: Normal rate and regular rhythm.     Pulses: Normal pulses and intact distal pulses.          Carotid pulses are  on the right side with bruit.    Heart sounds: No murmur heard. Pulmonary:     Effort: Pulmonary effort is normal.     Breath sounds: Normal breath sounds. No wheezing or rales.  Abdominal:     General: Bowel sounds are normal.     Palpations: Abdomen is soft.     Tenderness: There is no rebound.  Musculoskeletal:        General: No tenderness. Normal range of motion.     Right lower leg: No edema.     Left lower leg: No edema.  Lymphadenopathy:     Cervical: No cervical adenopathy.  Skin:    General: Skin is warm and dry.  Neurological:     Mental Status: He is alert and oriented to person, place, and time.     Cranial Nerves: No cranial nerve deficit.     Labs:   Lab Results  Component Value Date   WBC 9.9 04/17/2021   HGB 16.9 04/17/2021   HCT 49.0 04/17/2021   MCV 96.3 04/17/2021   PLT 181 04/17/2021    Recent Labs  Lab 04/15/21 1252 04/15/21 1351 04/17/21 0552  NA 136   < > 135  K 4.6   < > 4.6  CL 103   < > 106  CO2 25   < > 20*  BUN 10   < > 11  CREATININE 0.76   < > 0.94  CALCIUM 9.3   < > 8.9  PROT 7.4  --   --   BILITOT 0.9  --   --   ALKPHOS 96  --   --   ALT 30  --   --   AST 32  --   --   GLUCOSE 119*   < > 165*   < > = values in this interval not displayed.    Lipid Panel     Component Value Date/Time   CHOL 117 04/17/2021 0819   TRIG 195 (H) 04/17/2021 0819   HDL 26 (L) 04/17/2021 0819   CHOLHDL 4.5 04/17/2021 0819   VLDL 39 04/17/2021 0819   LDLCALC 52  04/17/2021 0819    BNP (last 3 results) No results for input(s): BNP in the last 8760 hours.  HEMOGLOBIN A1C Lab Results  Component  Value Date   HGBA1C 7.4 (H) 04/16/2021   MPG 165.68 04/16/2021    Cardiac Panel (last 3 results) No results for input(s): CKTOTAL, CKMB, RELINDX in the last 8760 hours.  Invalid input(s): TROPONINHS  No results found for: CKTOTAL, CKMB, CKMBINDEX   TSH No results for input(s): TSH in the last 8760 hours.    Radiology: CT ANGIO HEAD NECK W WO CM  Result Date: 04/16/2021 CLINICAL DATA:  Acute neurologic deficit. EXAM: CT ANGIOGRAPHY HEAD AND NECK TECHNIQUE: Multidetector CT imaging of the head and neck was performed using the standard protocol during bolus administration of intravenous contrast. Multiplanar CT image reconstructions and MIPs were obtained to evaluate the vascular anatomy. Carotid stenosis measurements (when applicable) are obtained utilizing NASCET criteria, using the distal internal carotid diameter as the denominator. CONTRAST:  73mL OMNIPAQUE IOHEXOL 350 MG/ML SOLN COMPARISON:  Brain MRI/MRA 04/16/2021 FINDINGS: CT HEAD FINDINGS Brain: No hemorrhage or extra-axial collection. Pericallosal lipoma again noted. The size and configuration of the ventricles and extra-axial CSF spaces are normal. There is no acute or chronic infarction. The brain parenchyma is normal. Skull: The visualized skull base, calvarium and extracranial soft tissues are normal. Sinuses/Orbits: No fluid levels or advanced mucosal thickening of the visualized paranasal sinuses. No mastoid or middle ear effusion. The orbits are normal. CTA NECK FINDINGS SKELETON: There is no bony spinal canal stenosis. No lytic or blastic lesion. OTHER NECK: Normal pharynx, larynx and major salivary glands. No cervical lymphadenopathy. Unremarkable thyroid gland. UPPER CHEST: No pneumothorax or pleural effusion. No nodules or masses. AORTIC ARCH: There is calcific atherosclerosis of the aortic  arch. There is no aneurysm, dissection or hemodynamically significant stenosis of the visualized portion of the aorta. Conventional 3 vessel aortic branching pattern. The visualized proximal subclavian arteries are widely patent. RIGHT CAROTID SYSTEM: No dissection, occlusion or aneurysm. There is predominantly low density atherosclerosis extending into the proximal ICA, resulting in 50% stenosis. LEFT CAROTID SYSTEM: No dissection, occlusion or aneurysm. There is predominantly low density atherosclerosis extending into the proximal ICA, resulting in less than 50% stenosis. VERTEBRAL ARTERIES: Codominant configuration. Both origins are clearly patent. There is no dissection, occlusion or flow-limiting stenosis to the skull base (V1-V3 segments). CTA HEAD FINDINGS POSTERIOR CIRCULATION: --Vertebral arteries: Bilateral atherosclerotic calcification with moderate stenosis, but both remain patent. --Inferior cerebellar arteries: Normal. --Basilar artery: Normal. --Superior cerebellar arteries: Normal. --Posterior cerebral arteries (PCA): Normal. ANTERIOR CIRCULATION: --Intracranial internal carotid arteries: Normal. --Anterior cerebral arteries (ACA): Normal. Both A1 segments are present. Patent anterior communicating artery (a-comm). --Middle cerebral arteries (MCA): Normal. VENOUS SINUSES: As permitted by contrast timing, patent. ANATOMIC VARIANTS: None Review of the MIP images confirms the above findings. IMPRESSION: 1. No emergent large vessel occlusion. 2. Bilateral proximal ICA plaque causing 50% stenosis on the right and no hemodynamically significant stenosis on the left. 3. Moderate bilateral vertebral artery V4 segment atherosclerotic calcification with moderate stenosis, but maintained patency. 4. Pericallosal lipoma, unchanged. Aortic atherosclerosis (ICD10-I70.0). Electronically Signed   By: Ulyses Jarred M.D.   On: 04/16/2021 21:13   MR ANGIO HEAD WO CONTRAST  Result Date: 04/16/2021 CLINICAL DATA:   Neuro deficit, acute, stroke suspected R vision loss; Neuro deficit, acute, stroke suspected EXAM: MRA NECK WITHOUT CONTRAST MRA HEAD WITHOUT CONTRAST TECHNIQUE: Angiographic images of the Circle of Willis were acquired using MRA technique without intravenous contrast. COMPARISON:  None. FINDINGS: MRA NECK FINDINGS Right carotid system: Limited evaluation proximally due to technique/artifact. The visible common carotid artery appear patent without significant stenosis.  Limited evaluation at the carotid bifurcation on time-of-flight due to motion. On postcontrast imaging there appears to be moderate to severe stenosis of the proximal ICA. Left carotid system: Limited evaluation proximally due to artifact/technique. The visible common carotid artery appears patent without significant stenosis. At the carotid bifurcation there is probably mild stenosis. Vertebral arteries: Limited evaluation of the vertebral artery origins and proximal vertebral arteries due to artifact/technique. The visualized vertebral arteries are patent bilaterally without evidence of significant (greater than 50%) stenosis. MRA HEAD FINDINGS Anterior circulation: Bilateral intracranial ICAs, MCAs and ACAs are patent without proximal hemodynamically significant stenosis. No aneurysm identified. Posterior circulation: Visualized intradural vertebral arteries, basilar artery, and posterior cerebral arteries are patent without proximal hemodynamically significant stenosis. Right posterior communicating artery noted. No aneurysm identified. IMPRESSION: MRA neck: 1. Limited study with suspected moderate to severe stenosis of the proximal right ICA. A CTA could provide more confident quantitative assessment if clinically indicated. 2. Probably mild stenosis of the left proximal ICA. MRA head: No large vessel occlusion or proximal hemodynamically significant stenosis. Electronically Signed   By: Feliberto Harts M.D.   On: 04/16/2021 15:27   MR  Angiogram Neck W or Wo Contrast  Result Date: 04/16/2021 CLINICAL DATA:  Neuro deficit, acute, stroke suspected R vision loss; Neuro deficit, acute, stroke suspected EXAM: MRA NECK WITHOUT CONTRAST MRA HEAD WITHOUT CONTRAST TECHNIQUE: Angiographic images of the Circle of Willis were acquired using MRA technique without intravenous contrast. COMPARISON:  None. FINDINGS: MRA NECK FINDINGS Right carotid system: Limited evaluation proximally due to technique/artifact. The visible common carotid artery appear patent without significant stenosis. Limited evaluation at the carotid bifurcation on time-of-flight due to motion. On postcontrast imaging there appears to be moderate to severe stenosis of the proximal ICA. Left carotid system: Limited evaluation proximally due to artifact/technique. The visible common carotid artery appears patent without significant stenosis. At the carotid bifurcation there is probably mild stenosis. Vertebral arteries: Limited evaluation of the vertebral artery origins and proximal vertebral arteries due to artifact/technique. The visualized vertebral arteries are patent bilaterally without evidence of significant (greater than 50%) stenosis. MRA HEAD FINDINGS Anterior circulation: Bilateral intracranial ICAs, MCAs and ACAs are patent without proximal hemodynamically significant stenosis. No aneurysm identified. Posterior circulation: Visualized intradural vertebral arteries, basilar artery, and posterior cerebral arteries are patent without proximal hemodynamically significant stenosis. Right posterior communicating artery noted. No aneurysm identified. IMPRESSION: MRA neck: 1. Limited study with suspected moderate to severe stenosis of the proximal right ICA. A CTA could provide more confident quantitative assessment if clinically indicated. 2. Probably mild stenosis of the left proximal ICA. MRA head: No large vessel occlusion or proximal hemodynamically significant stenosis.  Electronically Signed   By: Feliberto Harts M.D.   On: 04/16/2021 15:27   MR BRAIN WO CONTRAST  Result Date: 04/16/2021 CLINICAL DATA:  Neuro deficit, acute, stroke suspected EXAM: MRI HEAD WITHOUT CONTRAST TECHNIQUE: Multiplanar, multiecho pulse sequences of the brain and surrounding structures were obtained without intravenous contrast. COMPARISON:  Same day CT head. FINDINGS: Brain: Multiple small foci of restricted diffusion in the high right frontal cortex, suspicious for acute infarcts. Small areas of cortical T2 hyperintensity without restricted diffusion in the left parietal cortex, compatible with prior infarcts. Remote lacunar infarct in the left caudate. Scattered small T2/FLAIR hyperintensities in the white matter, nonspecific but compatible with mild chronic microvascular ischemic disease. Discrete T2 hyperintensities in the right greater than left basal ganglia are compatible with benign dilated perivascular spaces. No evidence of acute hemorrhage,  hydrocephalus, mass lesion, midline shift or extra-axial fluid collection. Pericallosal lipoma, as seen on same day CT head. Cavum septum pellucidum et vergae, anatomic variant. Vascular: See forthcoming MRA. Skull and upper cervical spine: Normal marrow signal. Sinuses/Orbits: Mild paranasal sinus mucosal thickening. Unremarkable orbits. Other: Trace mastoid effusions. IMPRESSION: 1. Multiple small foci of restricted diffusion in the high right frontal cortex, suspicious for acute infarcts. Mild associated edema without mass effect. 2. 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 3. 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 Electronically Signed   By: Margaretha Sheffield M.D.   On: 04/16/2021 14:40   ECHOCARDIOGRAM COMPLETE BUBBLE STUDY  Result Date: 04/16/2021    ECHOCARDIOGRAM REPORT   Patient Name:   DEAVON DIEBEL Date of Exam: 04/16/2021 Medical Rec #:  [id number]    Height: Accession  #:    [id number]   Weight: Date of Birth:  18-Jul-1970    BSA: Patient Age:    32 years     BP:           118/84 mmHg Patient Gender: M            HR:           68 bpm. Exam Location:  Inpatient Procedure: 2D Echo, Cardiac Doppler, Color Doppler and [place]ine Contrast Bubble            Study Indications:    Stroke  History:        Patient has no prior history of Echocardiogram examinations.                 CAD; Prior CABG.  Sonographer:    Merrie Roof RDCS Referring Phys: [place]  1. Basal inferio/inferolateral hypokinesis. . Left ventricular ejection fraction, by estimation, is 50 to 55%. The left ventricle has low normal function. The left ventricular internal cavity size was moderately dilated.  2. Right ventricular systolic function is normal. The right ventricular size is normal.  3. The mitral valve is normal in structure. Trivial mitral valve regurgitation.  4. The aortic valve is normal in structure. Aortic valve regurgitation is not visualized. FINDINGS  Left Ventricle: Basal inferio/inferolateral hypokinesis. Left ventricular ejection fraction, by estimation, is 50 to 55%. The left ventricle has low normal function. The left ventricular internal cavity size was moderately dilated. There is no left ventricular hypertrophy. Right Ventricle: The right ventricular size is normal. Right vetricular wall thickness was not assessed. Right ventricular systolic function is normal. Left Atrium: Left atrial size was normal in size. Right Atrium: Right atrial size was normal in size. Pericardium: There is no evidence of pericardial effusion. Mitral Valve: The mitral valve is normal in structure. Trivial mitral valve regurgitation. Tricuspid Valve: The tricuspid valve is normal in structure. Tricuspid valve regurgitation is trivial. Aortic Valve: The aortic valve is normal in structure. Aortic valve regurgitation is not visualized. Aortic valve mean gradient measures 3.0 mmHg. Aortic valve peak  gradient measures 5.5 mmHg. Aortic valve area, by VTI measures 1.90 cm. Pulmonic Valve: The pulmonic valve was normal in structure. Pulmonic valve regurgitation is not visualized. Aorta: The aortic root is normal in size and structure. IAS/Shunts: No atrial level shunt detected by color flow Doppler. Agitated saline contrast was given intravenously to evaluate for intracardiac shunting.  LEFT VENTRICLE PLAX 2D LVIDd:         5.60 cm   Diastology LVIDs:         4.00 cm  LV e' medial:    8.38 cm/s LV PW:         0.90 cm   LV E/e' medial:  10.1 LV IVS:        0.70 cm   LV e' lateral:   10.20 cm/s LVOT diam:     2.00 cm   LV E/e' lateral: 8.3 LV SV:         43 LVOT Area:     3.14 cm  RIGHT VENTRICLE RV Basal diam:  3.40 cm RV S prime:     8.27 cm/s TAPSE (M-mode): 1.4 cm LEFT ATRIUM             RIGHT ATRIUM LA diam:        4.30 cm RA Area:     13.00 cm LA Vol (A2C):   35.1 ml RA Volume:   30.70 ml LA Vol (A4C):   26.8 ml LA Biplane Vol: 30.6 ml  AORTIC VALVE AV Area (Vmax):    1.74 cm AV Area (Vmean):   1.66 cm AV Area (VTI):     1.90 cm AV Vmax:           117.00 cm/s AV Vmean:          78.700 cm/s AV VTI:            0.226 m AV Peak Grad:      5.5 mmHg AV Mean Grad:      3.0 mmHg LVOT Vmax:         64.70 cm/s LVOT Vmean:        41.600 cm/s LVOT VTI:          0.137 m LVOT/AV VTI ratio: 0.61  AORTA Ao Root diam: 3.30 cm Ao Asc diam:  3.30 cm MITRAL VALVE MV Area (PHT): 4.80 cm    SHUNTS MV Decel Time: 158 msec    Systemic VTI:  0.14 m MV E velocity: 84.80 cm/s  Systemic Diam: 2.00 cm MV A velocity: 65.60 cm/s MV E/A ratio:  1.29 Dorris Carnes MD Electronically signed by Dorris Carnes MD Signature Date/Time: 04/16/2021/2:11:04 PM    Final    VAS US CAROTID  Result Date: 04/17/2021 Carotid Arterial Duplex Study Patient Name:  ZAIDON HIGA  Date of Exam:   04/17/2021 Medical Rec #: [id number]     Accession #:    [id number] Date of Birth: Feb 16, 1971     Patient Gender: M Patient Age:   72 years Exam Location:  [facility] Procedure:      VAS US CAROTID Referring Phys: Vonna Kotyk ROBINS --------------------------------------------------------------------------------  Indications:       CVA, Visual disturbance and right retinal artery occlusion. Risk Factors:      Hypertension, hyperlipidemia, Diabetes, current smoker,                    coronary artery disease (CABG 2018). Other Factors:     50% stenosis of the right ICA noted on CTA. Comparison Study:  No prior study Performing Technologist: Sharion Dove RVS  Examination Guidelines: A complete evaluation includes B-mode imaging, spectral Doppler, color Doppler, and power Doppler as needed of all accessible portions of each vessel. Bilateral testing is considered an integral part of a complete examination. Limited examinations for reoccurring indications may be performed as noted.  Right Carotid Findings: +----------+--------+--------+--------+------------------+------------------+             PSV cm/s EDV cm/s Stenosis Plaque Description Comments            +----------+--------+--------+--------+------------------+------------------+  CCA Prox   76       21                                   intimal thickening  +----------+--------+--------+--------+------------------+------------------+  CCA Distal 69       20                                   intimal thickening  +----------+--------+--------+--------+------------------+------------------+  ICA Prox   152      69       40-59%   heterogenous                           +----------+--------+--------+--------+------------------+------------------+  ICA Mid    156      57                                                       +----------+--------+--------+--------+------------------+------------------+  ICA Distal 151      54                                                       +----------+--------+--------+--------+------------------+------------------+  ECA        102      19                                                        +----------+--------+--------+--------+------------------+------------------+ +----------+--------+-------+--------+-------------------+             PSV cm/s EDV cms Describe Arm Pressure (mmHG)  +----------+--------+-------+--------+-------------------+  Subclavian 147                                            +----------+--------+-------+--------+-------------------+ +---------+--------+--+--------+--+  Vertebral PSV cm/s 62 EDV cm/s 20  +---------+--------+--+--------+--+  Left Carotid Findings: +----------+--------+--------+--------+------------------+------------------+             PSV cm/s EDV cm/s Stenosis Plaque Description Comments            +----------+--------+--------+--------+------------------+------------------+  CCA Prox   63       16                focal and calcific                     +----------+--------+--------+--------+------------------+------------------+  CCA Distal 81       26                                   intimal thickening  +----------+--------+--------+--------+------------------+------------------+  ICA Prox   47       19       1-39%    heterogenous                           +----------+--------+--------+--------+------------------+------------------+  ICA Mid    72       25                                                       +----------+--------+--------+--------+------------------+------------------+  ECA        63       14                homogeneous                            +----------+--------+--------+--------+------------------+------------------+ +----------+--------+--------+--------+-------------------+             PSV cm/s EDV cm/s Describe Arm Pressure (mmHG)  +----------+--------+--------+--------+-------------------+  Subclavian 115                                             +----------+--------+--------+--------+-------------------+ +---------+--------+--+--------+--+  Vertebral PSV cm/s 35 EDV cm/s 10  +---------+--------+--+--------+--+   Summary: Right  Carotid: Velocities in the right ICA are consistent with a 40-59%                stenosis. Left Carotid: Velocities in the left ICA are consistent with a 1-39% stenosis. Vertebrals:  Bilateral vertebral arteries demonstrate antegrade flow. Subclavians: Normal flow hemodynamics were seen in bilateral subclavian              arteries. *See table(s) above for measurements and observations.     Preliminary     Scheduled Meds:  aspirin  81 mg Oral Daily   atorvastatin  40 mg Oral Daily   clopidogrel  75 mg Oral Daily   enoxaparin (LOVENOX) injection  40 mg Subcutaneous Q24H   insulin aspart  0-9 Units Subcutaneous TID WC   nicotine  21 mg Transdermal Daily   Continuous Infusions: PRN Meds:.acetaminophen **OR** acetaminophen (TYLENOL) oral liquid 160 mg/5 mL **OR** acetaminophen, metoprolol tartrate  CARDIAC STUDIES:  EKG 04/18/2021: Sinus tachycardia Right atrial enlargement Nonspecific T wave inversion inferior leads  Echocardiogram 04/16/2021:  1. Basal inferio/inferolateral hypokinesis. . Left ventricular ejection  fraction, by estimation, is 50 to 55%. The left ventricle has low normal  function. The left ventricular internal cavity size was moderately  dilated.   2. Right ventricular systolic function is normal. The right ventricular  size is normal.   3. The mitral valve is normal in structure. Trivial mitral valve  regurgitation.   4. The aortic valve is normal in structure. Aortic valve regurgitation is  not visualized.    Assessment & Recommendations:  51 y.o. Caucasian male  with hypertension, type 2 DM, mixed hyperlipidemia, CAD s/p CABGX4 (2018), h/o SBO, tobacco dependence, admitted with acute Rt CRAO, symptomatic Rt mod carotid stenosis.  Perioperative cardiac risk stratification: No angina, dyspnea symptoms at baseline with good functional capacity. Well compensated from cardiac standpoint. No further cardiac workup necessary at this time. Acceptable cardiac risk for  carotid endarterectomy Continue Aspirin, plavix, statin., metoprolol.    Nigel Mormon, MD Pager: [phone] Office: [phone]

## 2021-04-18 NOTE — Progress Notes (Addendum)
°  Progress Note    04/18/2021 8:37 AM * No surgery found *  51 year old male with symptomatic right ICA stenosis. He reports no new symptoms overnight. CEA vs Carotid Stenting was discussed with him in full detail yesterday by Dr. Karin Lieu and decision was made to proceed with right CEA. He has no further questions today. He will be NPO after midnight. Consent in chart. He is scheduled for right CEA tomorrow with Dr. Karin Lieu.    Graceann Congress, PA-C [facility] [phone] 04/18/2021 8:37 AM

## 2021-04-18 NOTE — Plan of Care (Signed)
°  Problem: Education: °Goal: Knowledge of General Education information will improve °Description: Including pain rating scale, medication(s)/side effects and non-pharmacologic comfort measures °Outcome: Progressing °  °Problem: Health Behavior/Discharge Planning: °Goal: Ability to manage health-related needs will improve °Outcome: Progressing °  °Problem: Clinical Measurements: °Goal: Ability to maintain clinical measurements within normal limits will improve °Outcome: Progressing °Goal: Will remain free from infection °Outcome: Progressing °Goal: Diagnostic test results will improve °Outcome: Progressing °Goal: Respiratory complications will improve °Outcome: Progressing °Goal: Cardiovascular complication will be avoided °Outcome: Progressing °  °Problem: Activity: °Goal: Risk for activity intolerance will decrease °Outcome: Progressing °  °Problem: Nutrition: °Goal: Adequate nutrition will be maintained °Outcome: Progressing °  °Problem: Coping: °Goal: Level of anxiety will decrease °Outcome: Progressing °  °Problem: Elimination: °Goal: Will not experience complications related to bowel motility °Outcome: Progressing °Goal: Will not experience complications related to urinary retention °Outcome: Progressing °  °Problem: Pain Managment: °Goal: General experience of comfort will improve °Outcome: Progressing °  °Problem: Safety: °Goal: Ability to remain free from injury will improve °Outcome: Progressing °  °Problem: Skin Integrity: °Goal: Risk for impaired skin integrity will decrease °Outcome: Progressing °  °Problem: Education: °Goal: Knowledge of disease or condition will improve °Outcome: Progressing °Goal: Knowledge of secondary prevention will improve (SELECT ALL) °Outcome: Progressing °Goal: Knowledge of patient specific risk factors will improve (INDIVIDUALIZE FOR PATIENT) °Outcome: Progressing °Goal: Individualized Educational Video(s) °Outcome: Progressing °  °Problem: Coping: °Goal: Will verbalize  positive feelings about self °Outcome: Progressing °Goal: Will identify appropriate support needs °Outcome: Progressing °  °Problem: Health Behavior/Discharge Planning: °Goal: Ability to manage health-related needs will improve °Outcome: Progressing °  °Problem: Self-Care: °Goal: Verbalization of feelings and concerns over difficulty with self-care will improve °Outcome: Progressing °  °

## 2021-04-18 NOTE — Progress Notes (Signed)
°  Transition of Care [facility]) Screening Note   Patient Details  Name: Martin Lee Date of Birth: 1970-08-03   Transition of Care [facility]) CM/SW Contact:    Kermit Balo, RN Phone Number: 04/18/2021, 11:33 AM    Transition of Care Department [facility]) has reviewed patient and no TOC needs have been identified at this time. We will continue to monitor patient advancement through interdisciplinary progression rounds. If new patient transition needs arise, please place a TOC consult. Pt for RCEA tomorrow so TOC will f/u after the procedure.

## 2021-04-19 ENCOUNTER — Encounter (HOSPITAL_COMMUNITY)
Admission: EM | Disposition: A | Discharge status: Home / Self Care | Payer: Self-pay | Source: Home / Self Care | Attending: Internal Medicine

## 2021-04-19 ENCOUNTER — Inpatient Hospital Stay (HOSPITAL_COMMUNITY): Payer: Commercial Managed Care - PPO | Admitting: Certified Registered Nurse Anesthetist

## 2021-04-19 ENCOUNTER — Encounter (HOSPITAL_COMMUNITY): Payer: Self-pay | Admitting: Internal Medicine

## 2021-04-19 DIAGNOSIS — I25700 Atherosclerosis of coronary artery bypass graft(s), unspecified, with unstable angina pectoris: Secondary | ICD-10-CM

## 2021-04-19 HISTORY — PX: ENDARTERECTOMY: SHX5162

## 2021-04-19 LAB — POCT ACTIVATED CLOTTING TIME
Activated Clotting Time: 299 seconds
Activated Clotting Time: 239 seconds

## 2021-04-19 LAB — CBC WITH DIFFERENTIAL/PLATELET
Abs Immature Granulocytes: 0.01 10*3/uL (ref 0.00–0.07)
Basophils Absolute: 0 10*3/uL (ref 0.0–0.1)
Basophils Relative: 0 %
Eosinophils Absolute: 0.1 10*3/uL (ref 0.0–0.5)
Eosinophils Relative: 1 %
HCT: 48.8 % (ref 39.0–52.0)
Hemoglobin: 16.9 g/dL (ref 13.0–17.0)
Immature Granulocytes: 0 %
Lymphocytes Relative: 40 %
Lymphs Abs: 3.4 10*3/uL (ref 0.7–4.0)
MCH: 32.8 pg (ref 26.0–34.0)
MCHC: 34.6 g/dL (ref 30.0–36.0)
MCV: 94.8 fL (ref 80.0–100.0)
Monocytes Absolute: 1 10*3/uL (ref 0.1–1.0)
Monocytes Relative: 12 %
Neutro Abs: 4 10*3/uL (ref 1.7–7.7)
Neutrophils Relative %: 47 %
Platelets: 164 10*3/uL (ref 150–400)
RBC: 5.15 MIL/uL (ref 4.22–5.81)
RDW: 12.1 % (ref 11.5–15.5)
WBC: 8.6 10*3/uL (ref 4.0–10.5)
nRBC: 0 % (ref 0.0–0.2)

## 2021-04-19 LAB — COMPREHENSIVE METABOLIC PANEL
ALT: 31 U/L (ref 0–44)
AST: 26 U/L (ref 15–41)
Albumin: 3.6 g/dL (ref 3.5–5.0)
Alkaline Phosphatase: 78 U/L (ref 38–126)
Anion gap: 8 (ref 5–15)
BUN: 14 mg/dL (ref 6–20)
CO2: 25 mmol/L (ref 22–32)
Calcium: 8.7 mg/dL — ABNORMAL LOW (ref 8.9–10.3)
Chloride: 103 mmol/L (ref 98–111)
Creatinine, Ser: 0.92 mg/dL (ref 0.61–1.24)
GFR, Estimated: >60 mL/min (ref >60)
Glucose, Bld: 141 mg/dL — ABNORMAL HIGH (ref 70–99)
Potassium: 4.2 mmol/L (ref 3.5–5.1)
Sodium: 136 mmol/L (ref 135–145)
Total Bilirubin: 0.6 mg/dL (ref 0.3–1.2)
Total Protein: 6.6 g/dL (ref 6.5–8.1)

## 2021-04-19 LAB — GLUCOSE, CAPILLARY
Glucose-Capillary: 191 mg/dL — ABNORMAL HIGH (ref 70–99)
Glucose-Capillary: 186 mg/dL — ABNORMAL HIGH (ref 70–99)
Glucose-Capillary: 247 mg/dL — ABNORMAL HIGH (ref 70–99)

## 2021-04-19 LAB — MAGNESIUM: Magnesium: 2 mg/dL (ref 1.7–2.4)

## 2021-04-19 SURGERY — ENDARTERECTOMY, CAROTID
Anesthesia: General | Laterality: Right

## 2021-04-19 MED ORDER — LACTATED RINGERS IV SOLN: INTRAVENOUS | Status: DC

## 2021-04-19 MED ORDER — LIDOCAINE-EPINEPHRINE (PF) 1 %-1:200000 IJ SOLN
INTRAMUSCULAR | Status: AC
  Filled 2021-04-19: qty 30

## 2021-04-19 MED ORDER — 0.9 % SODIUM CHLORIDE (POUR BTL) OPTIME
TOPICAL | Status: DC | PRN
  Administered 2021-04-19 (×2): 1000 mL

## 2021-04-19 MED ORDER — PROMETHAZINE HCL 25 MG/ML IJ SOLN: 6.2500 mg | INTRAMUSCULAR | Status: DC | PRN

## 2021-04-19 MED ORDER — MEPERIDINE HCL 25 MG/ML IJ SOLN: 6.2500 mg | INTRAMUSCULAR | Status: DC | PRN

## 2021-04-19 MED ORDER — HEPARIN 6000 UNIT IRRIGATION SOLUTION
Status: DC | PRN
  Administered 2021-04-19: 1

## 2021-04-19 MED ORDER — LIDOCAINE 2% (20 MG/ML) 5 ML SYRINGE
INTRAMUSCULAR | Status: DC | PRN
Start: 2021-04-19 — End: 2021-04-19
  Administered 2021-04-19: 60 mg via INTRAVENOUS

## 2021-04-19 MED ORDER — PROPOFOL 10 MG/ML IV BOLUS
INTRAVENOUS | Status: DC | PRN
  Administered 2021-04-19: 20 mg via INTRAVENOUS
  Administered 2021-04-19: 200 mg via INTRAVENOUS

## 2021-04-19 MED ORDER — CEFAZOLIN SODIUM-DEXTROSE 2-4 GM/100ML-% IV SOLN
INTRAVENOUS | Status: AC
  Filled 2021-04-19: qty 100

## 2021-04-19 MED ORDER — HEPARIN SODIUM (PORCINE) 1000 UNIT/ML IJ SOLN
INTRAMUSCULAR | Status: DC | PRN
  Administered 2021-04-19: 8000 [IU] via INTRAVENOUS
  Administered 2021-04-19: 3000 [IU] via INTRAVENOUS

## 2021-04-19 MED ORDER — FENTANYL CITRATE (PF) 250 MCG/5ML IJ SOLN
INTRAMUSCULAR | Status: AC
  Filled 2021-04-19: qty 5

## 2021-04-19 MED ORDER — PROTAMINE SULFATE 10 MG/ML IV SOLN
INTRAVENOUS | Status: DC | PRN
  Administered 2021-04-19: 50 mg via INTRAVENOUS

## 2021-04-19 MED ORDER — ACETAMINOPHEN 160 MG/5ML PO SOLN: 325.0000 mg | Freq: Once | ORAL | Status: DC | PRN

## 2021-04-19 MED ORDER — EPHEDRINE SULFATE 50 MG/ML IJ SOLN
INTRAMUSCULAR | Status: DC | PRN
Start: 2021-04-19 — End: 2021-04-19
  Administered 2021-04-19: 2.5 mg via INTRAVENOUS
  Administered 2021-04-19: 5 mg via INTRAVENOUS
  Administered 2021-04-19 (×2): 2.5 mg via INTRAVENOUS
  Administered 2021-04-19: 5 mg via INTRAVENOUS
  Administered 2021-04-19 (×3): 2.5 mg via INTRAVENOUS
  Administered 2021-04-19 (×2): 5 mg via INTRAVENOUS

## 2021-04-19 MED ORDER — AMISULPRIDE (ANTIEMETIC) 5 MG/2ML IV SOLN: 10.0000 mg | Freq: Once | INTRAVENOUS | Status: DC | PRN

## 2021-04-19 MED ORDER — ONDANSETRON HCL 4 MG/2ML IJ SOLN
INTRAMUSCULAR | Status: DC | PRN
  Administered 2021-04-19: 4 mg via INTRAVENOUS

## 2021-04-19 MED ORDER — THROMBIN (RECOMBINANT) 20000 UNITS EX SOLR
CUTANEOUS | Status: AC
  Filled 2021-04-19: qty 20000

## 2021-04-19 MED ORDER — LIDOCAINE HCL (PF) 1 % IJ SOLN
INTRAMUSCULAR | Status: AC
  Filled 2021-04-19: qty 30

## 2021-04-19 MED ORDER — LACTATED RINGERS IV SOLN: INTRAVENOUS | Status: DC | PRN

## 2021-04-19 MED ORDER — SODIUM CHLORIDE 0.9 % IV SOLN
0.0500 ug/kg/min | INTRAVENOUS | Status: DC
  Administered 2021-04-19: .1 ug/kg/min via INTRAVENOUS
  Filled 2021-04-19: qty 2000

## 2021-04-19 MED ORDER — ACETAMINOPHEN 325 MG PO TABS: 325.0000 mg | ORAL_TABLET | Freq: Once | ORAL | Status: DC | PRN

## 2021-04-19 MED ORDER — HYDROMORPHONE HCL 1 MG/ML IJ SOLN
INTRAMUSCULAR | Status: AC
  Filled 2021-04-19: qty 1

## 2021-04-19 MED ORDER — ROCURONIUM BROMIDE 10 MG/ML (PF) SYRINGE
PREFILLED_SYRINGE | INTRAVENOUS | Status: DC | PRN
  Administered 2021-04-19: 50 mg via INTRAVENOUS
  Administered 2021-04-19: 20 mg via INTRAVENOUS

## 2021-04-19 MED ORDER — FENTANYL CITRATE (PF) 250 MCG/5ML IJ SOLN
INTRAMUSCULAR | Status: DC | PRN
  Administered 2021-04-19 (×3): 50 ug via INTRAVENOUS
  Administered 2021-04-19: 25 ug via INTRAVENOUS
  Administered 2021-04-19: 50 ug via INTRAVENOUS
  Administered 2021-04-19: 25 ug via INTRAVENOUS

## 2021-04-19 MED ORDER — PHENYLEPHRINE HCL-NACL 20-0.9 MG/250ML-% IV SOLN
INTRAVENOUS | Status: DC | PRN
Start: 2021-04-19 — End: 2021-04-19
  Administered 2021-04-19: 50 ug/min via INTRAVENOUS

## 2021-04-19 MED ORDER — PROPOFOL 10 MG/ML IV BOLUS
INTRAVENOUS | Status: AC
  Filled 2021-04-19: qty 20

## 2021-04-19 MED ORDER — PHENYLEPHRINE HCL (PRESSORS) 10 MG/ML IV SOLN
INTRAVENOUS | Status: DC | PRN
  Administered 2021-04-19 (×2): 80 ug via INTRAVENOUS

## 2021-04-19 MED ORDER — DEXAMETHASONE SODIUM PHOSPHATE 10 MG/ML IJ SOLN
INTRAMUSCULAR | Status: DC | PRN
Start: 2021-04-19 — End: 2021-04-19
  Administered 2021-04-19: 5 mg via INTRAVENOUS

## 2021-04-19 MED ORDER — HYDROMORPHONE HCL 1 MG/ML IJ SOLN
0.2500 mg | INTRAMUSCULAR | Status: DC | PRN
  Administered 2021-04-19 (×3): 0.5 mg via INTRAVENOUS

## 2021-04-19 MED ORDER — HEPARIN 6000 UNIT IRRIGATION SOLUTION
Status: AC
  Filled 2021-04-19: qty 500

## 2021-04-19 MED ORDER — ACETAMINOPHEN 10 MG/ML IV SOLN: 1000.0000 mg | Freq: Once | INTRAVENOUS | Status: DC | PRN

## 2021-04-19 MED ORDER — SUGAMMADEX SODIUM 200 MG/2ML IV SOLN
INTRAVENOUS | Status: DC | PRN
  Administered 2021-04-19: 200 mg via INTRAVENOUS

## 2021-04-19 MED ORDER — CEFAZOLIN SODIUM-DEXTROSE 2-3 GM-%(50ML) IV SOLR
INTRAVENOUS | Status: DC | PRN
  Administered 2021-04-19: 2 g via INTRAVENOUS

## 2021-04-19 SURGICAL SUPPLY — 61 items
BAG COUNTER SPONGE SURGICOUNT (BAG) ×2 IMPLANT
CANISTER SUCT 3000ML PPV (MISCELLANEOUS) ×2 IMPLANT
CATH ROBINSON RED A/P 18FR (CATHETERS) ×2 IMPLANT
CLIP LIGATING EXTRA SM BLUE (MISCELLANEOUS) ×1 IMPLANT
COVER PROBE W GEL 5X96 (DRAPES) IMPLANT
COVER TRANSDUCER ULTRASND GEL (DISPOSABLE) ×3 IMPLANT
DERMABOND ADVANCED (GAUZE/BANDAGES/DRESSINGS) ×1
DERMABOND ADVANCED .7 DNX12 (GAUZE/BANDAGES/DRESSINGS) ×1 IMPLANT
DRAIN CHANNEL 15F RND FF W/TCR (WOUND CARE) ×1 IMPLANT
DRSG IV TEGADERM 3.5X4.5 STRL (GAUZE/BANDAGES/DRESSINGS) ×1 IMPLANT
DRSG TEGADERM 2-3/8X2-3/4 SM (GAUZE/BANDAGES/DRESSINGS) ×1 IMPLANT
ELECT REM PT RETURN 9FT ADLT (ELECTROSURGICAL) ×2
ELECTRODE REM PT RTRN 9FT ADLT (ELECTROSURGICAL) ×1 IMPLANT
EVACUATOR SILICONE 100CC (DRAIN) ×1 IMPLANT
GAUZE SPONGE 2X2 8PLY NS (GAUZE/BANDAGES/DRESSINGS) ×1 IMPLANT
GAUZE SPONGE 2X2 8PLY STRL LF (GAUZE/BANDAGES/DRESSINGS) IMPLANT
GLOVE SRG 8 PF TXTR STRL LF DI (GLOVE) ×2 IMPLANT
GLOVE SURG MICRO LTX SZ6 (GLOVE) ×1 IMPLANT
GLOVE SURG MICRO LTX SZ7 (GLOVE) ×1 IMPLANT
GLOVE SURG POLYISO LF SZ8 (GLOVE) IMPLANT
GLOVE SURG UNDER POLY LF SZ7.5 (GLOVE) ×1 IMPLANT
GLOVE SURG UNDER POLY LF SZ8 (GLOVE) ×2
GOWN STRL REUS W/ TWL LRG LVL3 (GOWN DISPOSABLE) ×2 IMPLANT
GOWN STRL REUS W/TWL 2XL LVL3 (GOWN DISPOSABLE) ×2 IMPLANT
GOWN STRL REUS W/TWL LRG LVL3 (GOWN DISPOSABLE) ×2
HEMOSTAT SNOW SURGICEL 2X4 (HEMOSTASIS) ×2 IMPLANT
IV ADAPTER SYR DOUBLE MALE LL (MISCELLANEOUS) IMPLANT
KIT BASIN OR (CUSTOM PROCEDURE TRAY) ×2 IMPLANT
KIT SHUNT ARGYLE CAROTID ART 6 (VASCULAR PRODUCTS) ×1 IMPLANT
KIT TURNOVER KIT B (KITS) ×2 IMPLANT
LOOP VESSEL MINI RED (MISCELLANEOUS) IMPLANT
NDL HYPO 25GX1X1/2 BEV (NEEDLE) IMPLANT
NDL SPNL 20GX3.5 QUINCKE YW (NEEDLE) IMPLANT
NEEDLE HYPO 25GX1X1/2 BEV (NEEDLE) ×2 IMPLANT
NEEDLE SPNL 20GX3.5 QUINCKE YW (NEEDLE) IMPLANT
NS IRRIG 1000ML POUR BTL (IV SOLUTION) ×6 IMPLANT
PACK CAROTID (CUSTOM PROCEDURE TRAY) ×2 IMPLANT
PAD ARMBOARD 7.5X6 YLW CONV (MISCELLANEOUS) ×4 IMPLANT
PATCH VASC XENOSURE 1CMX6CM (Vascular Products) ×1 IMPLANT
PATCH VASC XENOSURE 1X6 (Vascular Products) IMPLANT
POSITIONER HEAD DONUT 9IN (MISCELLANEOUS) ×2 IMPLANT
SHUNT CAROTID BYPASS 10 (VASCULAR PRODUCTS) IMPLANT
SHUNT CAROTID BYPASS 12FRX15.5 (VASCULAR PRODUCTS) IMPLANT
SPONGE GAUZE 2X2 STER 10/PKG (GAUZE/BANDAGES/DRESSINGS) ×1
SPONGE SURGIFOAM ABS GEL 100 (HEMOSTASIS) IMPLANT
STOPCOCK 4 WAY LG BORE MALE ST (IV SETS) IMPLANT
SURGIFLO W/THROMBIN 8M KIT (HEMOSTASIS) IMPLANT
SUT ETHILON 3 0 PS 1 (SUTURE) ×1 IMPLANT
SUT MNCRL AB 4-0 PS2 18 (SUTURE) ×2 IMPLANT
SUT PROLENE 5 0 C 1 24 (SUTURE) ×3 IMPLANT
SUT PROLENE 6 0 BV (SUTURE) ×3 IMPLANT
SUT SILK 3 0 (SUTURE)
SUT SILK 3-0 18XBRD TIE 12 (SUTURE) IMPLANT
SUT VIC AB 2-0 CT1 27 (SUTURE) ×1
SUT VIC AB 2-0 CT1 TAPERPNT 27 (SUTURE) ×1 IMPLANT
SUT VIC AB 3-0 SH 27 (SUTURE) ×1
SUT VIC AB 3-0 SH 27X BRD (SUTURE) ×1 IMPLANT
SYR CONTROL 10ML LL (SYRINGE) ×1 IMPLANT
TOWEL GREEN STERILE (TOWEL DISPOSABLE) ×2 IMPLANT
TUBING ART PRESS 48 MALE/FEM (TUBING) IMPLANT
WATER STERILE IRR 1000ML POUR (IV SOLUTION) ×2 IMPLANT

## 2021-04-19 NOTE — Progress Notes (Signed)
PROGRESS NOTE                                                                                                                                                                                                             Patient Demographics:    Martin Lee, is a 51 y.o. male, DOB - Dec 11, 1970, [id number]  Outpatient Primary MD for the patient is Adaline Sill, NP    LOS - 3  Admit date - 04/16/2021    Chief Complaint  Patient presents with   abnormal CT       Brief Narrative (HPI from H&P)     Martin Lee is a 51 y.o. male with medical history significant of CAD s/p CABG x4 in 2018, DM type II, SBO, and tobacco abuse presents with complaints of vision change of his right eye which started yesterday morning when he woke up, initially came to the ER but left before ER MD could see him, he subsequently came back to the hospital where he was diagnosed with right ICA stenosis, right frontal lobe stroke and right retinal artery occlusion.   Subjective:   Patient in bed in PACU, appears comfortable, denies any headache, no fever, no chest pain or pressure, no shortness of breath , no abdominal pain. No new focal weakness,   Still extremely blurry vision out of the right eye.   Assessment  & Plan :     Acute right eye vision loss-due to acute right central artery occlusion - in a patient with history of hypertension, ongoing nicotine abuse, MRI evidence of right frontal CVA, right ICA stenosis as in #2.  He has been seen by ophthalmology in the office already, neurology on board, currently on aspirin Plavix and statin for secondary prevention.  Stroke work-up per stroke team.  Vascular surgery on board as well for right ICA stenosis as below.  2.  Right ICA stenosis. Post surgical correction on 04/19/2021, drain in place, continue Med Rx, counseled to stop smoking.  3.  Hypertension.  Allow for permissive hypertension due  to right frontal CVA.  4.  DM type II.  Sliding scale insulin, continue home regimen upon discharge.  5.  Dyslipidemia.  On statin with satisfactory LDL.  6.  Smoking.  Counseled to quit smoking.  7. CAD with CABG.  Chest pain-free and nonacute EKG,  already on aspirin beta-blocker and statin at home for secondary prevention which will be continued, strictly counseled to stop smoking.  Outpatient cardiology follow-up.  Global hypokinesis likely chronic.  No acute chest symptoms.  EKG is nonacute.     Lab Results  Component Value Date   HGBA1C 7.4 (H) 04/16/2021    Lab Results  Component Value Date   CHOL 117 04/17/2021   HDL 26 (L) 04/17/2021   LDLCALC 52 04/17/2021   LDLDIRECT 76.5 04/16/2021   TRIG 195 (H) 04/17/2021   CHOLHDL 4.5 04/17/2021    CBG (last 3)  Recent Labs    04/18/21 2108 04/19/21 0603 04/19/21 1043  GLUCAP 133* 191* 186*        Condition - Fair  Family Communication  :  None present  Code Status :  Full  Consults  :  Neuro, VVS  PUD Prophylaxis :    Procedures  :     R. Carotid endarterectomy on 04/19/21 by Dr Virl Cagey  MRI -  1. Multiple small foci of restricted diffusion in the high right frontal cortex, suspicious for acute infarcts. Mild associated edema without mass effect. 2. 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 3. 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  MRA - MRA neck: 1. Limited study with suspected moderate to severe stenosis of the proximal right ICA. A CTA could provide more confident quantitative assessment if clinically indicated. 2. Probably mild stenosis of the left proximal ICA. MRA head: No large vessel occlusion or proximal hemodynamically significant stenosis.  CTA - 1. No emergent large vessel occlusion. 2. Bilateral proximal ICA plaque causing 50% stenosis on the right and no hemodynamically significant stenosis on the left. 3. Moderate bilateral  vertebral artery V4 segment atherosclerotic calcification with moderate stenosis, but maintained patency. 4. Pericallosal lipoma, unchanged. Aortic atherosclerosis   TTE - 1. Basal inferio/inferolateral hypokinesis. . Left ventricular ejection fraction, by estimation, is 50 to 55%. The left ventricle has low normal function. The left ventricular internal cavity size was moderately dilated.  2. Right ventricular systolic function is normal. The right ventricular size is normal.  3. The mitral valve is normal in structure. Trivial mitral valve regurgitation.  4. The aortic valve is normal in structure. Aortic valve regurgitation is not visualized.       Disposition Plan  :    Status is: Inpatient  Remains inpatient appropriate because: R eye vision loss  DVT Prophylaxis  :    enoxaparin (LOVENOX) injection 40 mg Start: 04/16/21 1600    Lab Results  Component Value Date   PLT 164 04/19/2021    Diet :  Diet Order             Diet NPO time specified  Diet effective midnight                    Inpatient Medications  Scheduled Meds:  [MAR Hold] aspirin  81 mg Oral Daily   [MAR Hold] atorvastatin  40 mg Oral Daily   [MAR Hold] Chlorhexidine Gluconate Cloth  6 each Topical Q0600   [MAR Hold] clopidogrel  75 mg Oral Daily   [MAR Hold] enoxaparin (LOVENOX) injection  40 mg Subcutaneous Q24H   HYDROmorphone       [MAR Hold] insulin aspart  0-9 Units Subcutaneous TID WC   [MAR Hold] mupirocin ointment  1 application Nasal BID   [MAR Hold] nicotine  21 mg Transdermal Daily   Continuous  Infusions:  acetaminophen     ceFAZolin     lactated ringers     remifentanil (ULTIVA) 2 mg in 100 mL normal saline (20 mcg/mL) Optime Stopped (04/19/21 0950)   PRN Meds:.acetaminophen, acetaminophen **OR** acetaminophen (TYLENOL) oral liquid 160 mg/5 mL, [MAR Hold] acetaminophen **OR** [MAR Hold] acetaminophen (TYLENOL) oral liquid 160 mg/5 mL **OR** [MAR Hold] acetaminophen, amisulpride,  HYDROmorphone (DILAUDID) injection, meperidine (DEMEROL) injection, [MAR Hold] metoprolol tartrate, promethazine  Antibiotics  :    Anti-infectives (From admission, onward)    Start     Dose/Rate Route Frequency Ordered Stop   04/19/21 0719  ceFAZolin (ANCEF) 2-4 GM/100ML-% IVPB       Note to Pharmacy: Gregery Na H: cabinet override      04/19/21 0719 04/19/21 1929        Time Spent in minutes  30   Lala Lund M.D on 04/19/2021 at 12:31 PM  To page go to [email or web address]   [facility] -  Office  [phone]  See all Orders from today for further details    Objective:   Vitals:   04/19/21 1130 04/19/21 1145 04/19/21 1200 04/19/21 1215  BP: 102/71 104/73 104/70 108/78  Pulse: 81 81 83 91  Resp: 10 11 12 13   Temp:      TempSrc:      SpO2: 96% 96% 94% 99%  Weight:      Height:        Wt Readings from Last 3 Encounters:  04/17/21 81.3 kg     Intake/Output Summary (Last 24 hours) at 04/19/2021 1231 Last data filed at 04/19/2021 1034 Gross per 24 hour  Intake 1600 ml  Output 100 ml  Net 1500 ml     Physical Exam  Awake Alert, No new F.N deficits, Normal affect [place].AT, R neck JP drain in place Supple Neck, No JVD,   Symmetrical Chest wall movement, Good air movement bilaterally, CTAB RRR,No Gallops, Rubs or new Murmurs,  +ve B.Sounds, Abd Soft, No tenderness,   No Cyanosis,         Data Review:    CBC Recent Labs  Lab 04/15/21 1252 04/15/21 1351 04/16/21 1037 04/17/21 0552 04/19/21 0201  WBC 10.3  --  10.6* 9.9 8.6  HGB 17.3* 17.7* 17.8* 16.9 16.9  HCT 51.4 52.0 51.0 49.0 48.8  PLT 195  --  194 181 164  MCV 97.9  --  96.2 96.3 94.8  MCH 33.0  --  33.6 33.2 32.8  MCHC 33.7  --  34.9 34.5 34.6  RDW 12.3  --  12.3 12.4 12.1  LYMPHSABS 3.4  --   --   --  3.4  MONOABS 0.9  --   --   --  1.0  EOSABS 0.1  --   --   --  0.1  BASOSABS 0.0  --   --   --  0.0    Electrolytes Recent Labs  Lab 04/15/21 1252 04/15/21 1351 04/16/21 1037  04/16/21 1149 04/17/21 0552 04/17/21 0758 04/19/21 0201  NA 136 139 138  --  135  --  136  K 4.6 4.4 5.0  --  4.6  --  4.2  CL 103 105 106  --  106  --  103  CO2 25  --  23  --  20*  --  25  GLUCOSE 119* 114* 139*  --  165*  --  141*  BUN 10 12 11   --  11  --  14  CREATININE 0.76  0.70 0.78  --  0.94  --  0.92  CALCIUM 9.3  --  9.5  --  8.9  --  8.7*  AST 32  --   --   --   --   --  26  ALT 30  --   --   --   --   --  31  ALKPHOS 96  --   --   --   --   --  78  BILITOT 0.9  --   --   --   --   --  0.6  ALBUMIN 4.1  --   --   --   --   --  3.6  MG  --   --   --   --   --   --  2.0  CRP  --   --   --   --   --  <0.5  --   INR 1.0  --   --   --   --   --   --   HGBA1C  --   --   --  7.4*  --   --   --     ------------------------------------------------------------------------------------------------------------------ Recent Labs    04/17/21 0819  CHOL 117  HDL 26*  LDLCALC 52  TRIG 195*  CHOLHDL 4.5    Lab Results  Component Value Date   HGBA1C 7.4 (H) 04/16/2021    No results for input(s): TSH, T4TOTAL, T3FREE, THYROIDAB in the last 72 hours.  Invalid input(s): FREET3 ------------------------------------------------------------------------------------------------------------------ ID Labs Recent Labs  Lab 04/15/21 1252 04/15/21 1351 04/16/21 1037 04/17/21 0552 04/17/21 0758 04/19/21 0201  WBC 10.3  --  10.6* 9.9  --  8.6  PLT 195  --  194 181  --  164  CRP  --   --   --   --  <0.5  --   CREATININE 0.76 0.70 0.78 0.94  --  0.92   Cardiac Enzymes No results for input(s): CKMB, TROPONINI, MYOGLOBIN in the last 168 hours.  Invalid input(s): CK     Radiology Reports CT ANGIO HEAD NECK W WO CM  Result Date: 04/16/2021 CLINICAL DATA:  Acute neurologic deficit. EXAM: CT ANGIOGRAPHY HEAD AND NECK TECHNIQUE: Multidetector CT imaging of the head and neck was performed using the standard protocol during bolus administration of intravenous contrast. Multiplanar CT  image reconstructions and MIPs were obtained to evaluate the vascular anatomy. Carotid stenosis measurements (when applicable) are obtained utilizing NASCET criteria, using the distal internal carotid diameter as the denominator. CONTRAST:  59mL OMNIPAQUE IOHEXOL 350 MG/ML SOLN COMPARISON:  Brain MRI/MRA 04/16/2021 FINDINGS: CT HEAD FINDINGS Brain: No hemorrhage or extra-axial collection. Pericallosal lipoma again noted. The size and configuration of the ventricles and extra-axial CSF spaces are normal. There is no acute or chronic infarction. The brain parenchyma is normal. Skull: The visualized skull base, calvarium and extracranial soft tissues are normal. Sinuses/Orbits: No fluid levels or advanced mucosal thickening of the visualized paranasal sinuses. No mastoid or middle ear effusion. The orbits are normal. CTA NECK FINDINGS SKELETON: There is no bony spinal canal stenosis. No lytic or blastic lesion. OTHER NECK: Normal pharynx, larynx and major salivary glands. No cervical lymphadenopathy. Unremarkable thyroid gland. UPPER CHEST: No pneumothorax or pleural effusion. No nodules or masses. AORTIC ARCH: There is calcific atherosclerosis of the aortic arch. There is no aneurysm, dissection or hemodynamically significant stenosis of the visualized portion of the aorta. Conventional 3 vessel aortic branching pattern.  The visualized proximal subclavian arteries are widely patent. RIGHT CAROTID SYSTEM: No dissection, occlusion or aneurysm. There is predominantly low density atherosclerosis extending into the proximal ICA, resulting in 50% stenosis. LEFT CAROTID SYSTEM: No dissection, occlusion or aneurysm. There is predominantly low density atherosclerosis extending into the proximal ICA, resulting in less than 50% stenosis. VERTEBRAL ARTERIES: Codominant configuration. Both origins are clearly patent. There is no dissection, occlusion or flow-limiting stenosis to the skull base (V1-V3 segments). CTA HEAD FINDINGS  POSTERIOR CIRCULATION: --Vertebral arteries: Bilateral atherosclerotic calcification with moderate stenosis, but both remain patent. --Inferior cerebellar arteries: Normal. --Basilar artery: Normal. --Superior cerebellar arteries: Normal. --Posterior cerebral arteries (PCA): Normal. ANTERIOR CIRCULATION: --Intracranial internal carotid arteries: Normal. --Anterior cerebral arteries (ACA): Normal. Both A1 segments are present. Patent anterior communicating artery (a-comm). --Middle cerebral arteries (MCA): Normal. VENOUS SINUSES: As permitted by contrast timing, patent. ANATOMIC VARIANTS: None Review of the MIP images confirms the above findings. IMPRESSION: 1. No emergent large vessel occlusion. 2. Bilateral proximal ICA plaque causing 50% stenosis on the right and no hemodynamically significant stenosis on the left. 3. Moderate bilateral vertebral artery V4 segment atherosclerotic calcification with moderate stenosis, but maintained patency. 4. Pericallosal lipoma, unchanged. Aortic atherosclerosis (ICD10-I70.0). Electronically Signed   By: Ulyses Jarred M.D.   On: 04/16/2021 21:13   CT HEAD WO CONTRAST (5MM)  Result Date: 04/15/2021 CLINICAL DATA:  Neuro deficit, acute stroke suspected. Reported to be unable to see out of his left eye upon awakening today. EXAM: CT HEAD WITHOUT CONTRAST TECHNIQUE: Contiguous axial images were obtained from the base of the skull through the vertex without intravenous contrast. COMPARISON:  None. FINDINGS: Brain: There is no evidence of acute intracranial hemorrhage, mass lesion, brain edema or extra-axial fluid collection. The ventricles and subarachnoid spaces are appropriately sized for age. There is no CT evidence of acute cortical infarction. There is a large curvilinear pericallosal lipoma, measuring up to 5.4 cm in length on image 31/6. Vascular: Intracranial vascular calcifications. No hyperdense vessel identified. Skull: Negative for fracture or focal lesion.  Sinuses/Orbits: Probable small mucous retention cyst inferiorly in the left maxillary sinus. The additional visualized paranasal sinuses, mastoid air cells and middle ears are clear. Other: None. IMPRESSION: 1. No acute intracranial findings demonstrated. No CT evidence of acute stroke or hemorrhage. 2. Incidental large curvilinear pericallosal lipoma. Electronically Signed   By: Richardean Sale M.D.   On: 04/15/2021 13:01   MR ANGIO HEAD WO CONTRAST  Result Date: 04/16/2021 CLINICAL DATA:  Neuro deficit, acute, stroke suspected R vision loss; Neuro deficit, acute, stroke suspected EXAM: MRA NECK WITHOUT CONTRAST MRA HEAD WITHOUT CONTRAST TECHNIQUE: Angiographic images of the Circle of Willis were acquired using MRA technique without intravenous contrast. COMPARISON:  None. FINDINGS: MRA NECK FINDINGS Right carotid system: Limited evaluation proximally due to technique/artifact. The visible common carotid artery appear patent without significant stenosis. Limited evaluation at the carotid bifurcation on time-of-flight due to motion. On postcontrast imaging there appears to be moderate to severe stenosis of the proximal ICA. Left carotid system: Limited evaluation proximally due to artifact/technique. The visible common carotid artery appears patent without significant stenosis. At the carotid bifurcation there is probably mild stenosis. Vertebral arteries: Limited evaluation of the vertebral artery origins and proximal vertebral arteries due to artifact/technique. The visualized vertebral arteries are patent bilaterally without evidence of significant (greater than 50%) stenosis. MRA HEAD FINDINGS Anterior circulation: Bilateral intracranial ICAs, MCAs and ACAs are patent without proximal hemodynamically significant stenosis. No aneurysm identified. Posterior circulation: Visualized  intradural vertebral arteries, basilar artery, and posterior cerebral arteries are patent without proximal hemodynamically  significant stenosis. Right posterior communicating artery noted. No aneurysm identified. IMPRESSION: MRA neck: 1. Limited study with suspected moderate to severe stenosis of the proximal right ICA. A CTA could provide more confident quantitative assessment if clinically indicated. 2. Probably mild stenosis of the left proximal ICA. MRA head: No large vessel occlusion or proximal hemodynamically significant stenosis. Electronically Signed   By: Margaretha Sheffield M.D.   On: 04/16/2021 15:27   MR Angiogram Neck W or Wo Contrast  Result Date: 04/16/2021 CLINICAL DATA:  Neuro deficit, acute, stroke suspected R vision loss; Neuro deficit, acute, stroke suspected EXAM: MRA NECK WITHOUT CONTRAST MRA HEAD WITHOUT CONTRAST TECHNIQUE: Angiographic images of the Circle of Willis were acquired using MRA technique without intravenous contrast. COMPARISON:  None. FINDINGS: MRA NECK FINDINGS Right carotid system: Limited evaluation proximally due to technique/artifact. The visible common carotid artery appear patent without significant stenosis. Limited evaluation at the carotid bifurcation on time-of-flight due to motion. On postcontrast imaging there appears to be moderate to severe stenosis of the proximal ICA. Left carotid system: Limited evaluation proximally due to artifact/technique. The visible common carotid artery appears patent without significant stenosis. At the carotid bifurcation there is probably mild stenosis. Vertebral arteries: Limited evaluation of the vertebral artery origins and proximal vertebral arteries due to artifact/technique. The visualized vertebral arteries are patent bilaterally without evidence of significant (greater than 50%) stenosis. MRA HEAD FINDINGS Anterior circulation: Bilateral intracranial ICAs, MCAs and ACAs are patent without proximal hemodynamically significant stenosis. No aneurysm identified. Posterior circulation: Visualized intradural vertebral arteries, basilar artery, and  posterior cerebral arteries are patent without proximal hemodynamically significant stenosis. Right posterior communicating artery noted. No aneurysm identified. IMPRESSION: MRA neck: 1. Limited study with suspected moderate to severe stenosis of the proximal right ICA. A CTA could provide more confident quantitative assessment if clinically indicated. 2. Probably mild stenosis of the left proximal ICA. MRA head: No large vessel occlusion or proximal hemodynamically significant stenosis. Electronically Signed   By: Margaretha Sheffield M.D.   On: 04/16/2021 15:27   MR BRAIN WO CONTRAST  Result Date: 04/16/2021 CLINICAL DATA:  Neuro deficit, acute, stroke suspected EXAM: MRI HEAD WITHOUT CONTRAST TECHNIQUE: Multiplanar, multiecho pulse sequences of the brain and surrounding structures were obtained without intravenous contrast. COMPARISON:  Same day CT head. FINDINGS: Brain: Multiple small foci of restricted diffusion in the high right frontal cortex, suspicious for acute infarcts. Small areas of cortical T2 hyperintensity without restricted diffusion in the left parietal cortex, compatible with prior infarcts. Remote lacunar infarct in the left caudate. Scattered small T2/FLAIR hyperintensities in the white matter, nonspecific but compatible with mild chronic microvascular ischemic disease. Discrete T2 hyperintensities in the right greater than left basal ganglia are compatible with benign dilated perivascular spaces. No evidence of acute hemorrhage, hydrocephalus, mass lesion, midline shift or extra-axial fluid collection. Pericallosal lipoma, as seen on same day CT head. Cavum septum pellucidum et vergae, anatomic variant. Vascular: See forthcoming MRA. Skull and upper cervical spine: Normal marrow signal. Sinuses/Orbits: Mild paranasal sinus mucosal thickening. Unremarkable orbits. Other: Trace mastoid effusions. IMPRESSION: 1. Multiple small foci of restricted diffusion in the high right frontal cortex,  suspicious for acute infarcts. Mild associated edema without mass effect. 2. 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 3. 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 Electronically Signed   By: Margaretha Sheffield M.D.   On: 04/16/2021  14:40   ECHOCARDIOGRAM COMPLETE BUBBLE STUDY  Result Date: 04/16/2021    ECHOCARDIOGRAM REPORT   Patient Name:   RIGOBERTO NANDIN Date of Exam: 04/16/2021 Medical Rec #:  [id number]    Height: Accession #:    [id number]   Weight: Date of Birth:  05-01-1970    BSA: Patient Age:    79 years     BP:           118/84 mmHg Patient Gender: M            HR:           68 bpm. Exam Location:  Inpatient Procedure: 2D Echo, Cardiac Doppler, Color Doppler and Saline Contrast Bubble            Study Indications:    Stroke  History:        Patient has no prior history of Echocardiogram examinations.                 CAD; Prior CABG.  Sonographer:    Merrie Roof RDCS Referring Phys: Diagonal  1. Basal inferio/inferolateral hypokinesis. . Left ventricular ejection fraction, by estimation, is 50 to 55%. The left ventricle has low normal function. The left ventricular internal cavity size was moderately dilated.  2. Right ventricular systolic function is normal. The right ventricular size is normal.  3. The mitral valve is normal in structure. Trivial mitral valve regurgitation.  4. The aortic valve is normal in structure. Aortic valve regurgitation is not visualized. FINDINGS  Left Ventricle: Basal inferio/inferolateral hypokinesis. Left ventricular ejection fraction, by estimation, is 50 to 55%. The left ventricle has low normal function. The left ventricular internal cavity size was moderately dilated. There is no left ventricular hypertrophy. Right Ventricle: The right ventricular size is normal. Right vetricular wall thickness was not assessed. Right ventricular systolic function is normal. Left Atrium: Left  atrial size was normal in size. Right Atrium: Right atrial size was normal in size. Pericardium: There is no evidence of pericardial effusion. Mitral Valve: The mitral valve is normal in structure. Trivial mitral valve regurgitation. Tricuspid Valve: The tricuspid valve is normal in structure. Tricuspid valve regurgitation is trivial. Aortic Valve: The aortic valve is normal in structure. Aortic valve regurgitation is not visualized. Aortic valve mean gradient measures 3.0 mmHg. Aortic valve peak gradient measures 5.5 mmHg. Aortic valve area, by VTI measures 1.90 cm. Pulmonic Valve: The pulmonic valve was normal in structure. Pulmonic valve regurgitation is not visualized. Aorta: The aortic root is normal in size and structure. IAS/Shunts: No atrial level shunt detected by color flow Doppler. Agitated saline contrast was given intravenously to evaluate for intracardiac shunting.  LEFT VENTRICLE PLAX 2D LVIDd:         5.60 cm   Diastology LVIDs:         4.00 cm   LV e' medial:    8.38 cm/s LV PW:         0.90 cm   LV E/e' medial:  10.1 LV IVS:        0.70 cm   LV e' lateral:   10.20 cm/s LVOT diam:     2.00 cm   LV E/e' lateral: 8.3 LV SV:         43 LVOT Area:     3.14 cm  RIGHT VENTRICLE RV Basal diam:  3.40 cm RV S prime:     8.27 cm/s TAPSE (M-mode): 1.4 cm LEFT ATRIUM  RIGHT ATRIUM LA diam:        4.30 cm RA Area:     13.00 cm LA Vol (A2C):   35.1 ml RA Volume:   30.70 ml LA Vol (A4C):   26.8 ml LA Biplane Vol: 30.6 ml  AORTIC VALVE AV Area (Vmax):    1.74 cm AV Area (Vmean):   1.66 cm AV Area (VTI):     1.90 cm AV Vmax:           117.00 cm/s AV Vmean:          78.700 cm/s AV VTI:            0.226 m AV Peak Grad:      5.5 mmHg AV Mean Grad:      3.0 mmHg LVOT Vmax:         64.70 cm/s LVOT Vmean:        41.600 cm/s LVOT VTI:          0.137 m LVOT/AV VTI ratio: 0.61  AORTA Ao Root diam: 3.30 cm Ao Asc diam:  3.30 cm MITRAL VALVE MV Area (PHT): 4.80 cm    SHUNTS MV Decel Time: 158 msec    Systemic  VTI:  0.14 m MV E velocity: 84.80 cm/s  Systemic Diam: 2.00 cm MV A velocity: 65.60 cm/s MV E/A ratio:  1.29 Dorris Carnes MD Electronically signed by Dorris Carnes MD Signature Date/Time: 04/16/2021/2:11:04 PM    Final    VAS US CAROTID  Result Date: 04/18/2021 Carotid Arterial Duplex Study Patient Name:  ANTONE VILLARUZ  Date of Exam:   04/17/2021 Medical Rec #: [id number]     Accession #:    [id number] Date of Birth: 08/03/1970     Patient Gender: M Patient Age:   70 years Exam Location:  [facility] Procedure:      VAS US CAROTID Referring Phys: Vonna Kotyk ROBINS --------------------------------------------------------------------------------  Indications:       CVA, Visual disturbance and right retinal artery occlusion. Risk Factors:      Hypertension, hyperlipidemia, Diabetes, current smoker,                    coronary artery disease (CABG 2018). Other Factors:     50% stenosis of the right ICA noted on CTA. Comparison Study:  No prior study Performing Technologist: Sharion Dove RVS  Examination Guidelines: A complete evaluation includes B-mode imaging, spectral Doppler, color Doppler, and power Doppler as needed of all accessible portions of each vessel. Bilateral testing is considered an integral part of a complete examination. Limited examinations for reoccurring indications may be performed as noted.  Right Carotid Findings: +----------+--------+--------+--------+------------------+------------------+             PSV cm/s EDV cm/s Stenosis Plaque Description Comments            +----------+--------+--------+--------+------------------+------------------+  CCA Prox   76       21                                   intimal thickening  +----------+--------+--------+--------+------------------+------------------+  CCA Distal 69       20                                   intimal thickening  +----------+--------+--------+--------+------------------+------------------+  ICA Prox   152      69  40-59%   heterogenous                            +----------+--------+--------+--------+------------------+------------------+  ICA Mid    156      57                                                       +----------+--------+--------+--------+------------------+------------------+  ICA Distal 151      54                                                       +----------+--------+--------+--------+------------------+------------------+  ECA        102      19                                                       +----------+--------+--------+--------+------------------+------------------+ +----------+--------+-------+--------+-------------------+             PSV cm/s EDV cms Describe Arm Pressure (mmHG)  +----------+--------+-------+--------+-------------------+  Subclavian 147                                            +----------+--------+-------+--------+-------------------+ +---------+--------+--+--------+--+  Vertebral PSV cm/s 62 EDV cm/s 20  +---------+--------+--+--------+--+  Left Carotid Findings: +----------+--------+--------+--------+------------------+------------------+             PSV cm/s EDV cm/s Stenosis Plaque Description Comments            +----------+--------+--------+--------+------------------+------------------+  CCA Prox   63       16                focal and calcific                     +----------+--------+--------+--------+------------------+------------------+  CCA Distal 81       26                                   intimal thickening  +----------+--------+--------+--------+------------------+------------------+  ICA Prox   47       19       1-39%    heterogenous                           +----------+--------+--------+--------+------------------+------------------+  ICA Mid    72       25                                                       +----------+--------+--------+--------+------------------+------------------+  ECA        63       14  homogeneous                             +----------+--------+--------+--------+------------------+------------------+ +----------+--------+--------+--------+-------------------+             PSV cm/s EDV cm/s Describe Arm Pressure (mmHG)  +----------+--------+--------+--------+-------------------+  Subclavian 115                                             +----------+--------+--------+--------+-------------------+ +---------+--------+--+--------+--+  Vertebral PSV cm/s 35 EDV cm/s 10  +---------+--------+--+--------+--+   Summary: Right Carotid: Velocities in the right ICA are consistent with a 40-59%                stenosis. Left Carotid: Velocities in the left ICA are consistent with a 1-39% stenosis. Vertebrals:  Bilateral vertebral arteries demonstrate antegrade flow. Subclavians: Normal flow hemodynamics were seen in bilateral subclavian              arteries. *See table(s) above for measurements and observations.  Electronically signed by Servando Snare MD on 04/18/2021 at 3:12:20 PM.    Final

## 2021-04-19 NOTE — Transfer of Care (Signed)
Immediate Anesthesia Transfer of Care Note  Patient: Martin Lee  Procedure(s) Performed: RIGHT CAROTID ENDARTERECTOMY (Right)  Patient Location: PACU  Anesthesia Type:General  Level of Consciousness: awake, alert  and oriented  Airway & Oxygen Therapy: Patient Spontanous Breathing  Post-op Assessment: Report given to RN and Post -op Vital signs reviewed and stable  Post vital signs: Reviewed and stable  Last Vitals:  Vitals Value Taken Time  BP 108/73 04/19/21 1043  Temp    Pulse 94 04/19/21 1045  Resp 15 04/19/21 1045  SpO2 94 % 04/19/21 1045  Vitals shown include unvalidated device data.  Last Pain:  Vitals:   04/19/21 0427  TempSrc: Oral  PainSc:          Complications: No notable events documented.

## 2021-04-19 NOTE — Anesthesia Postprocedure Evaluation (Signed)
Anesthesia Post Note  Patient: Martin Lee  Procedure(s) Performed: RIGHT CAROTID ENDARTERECTOMY (Right)     Patient location during evaluation: PACU Anesthesia Type: General Level of consciousness: awake and alert Pain management: pain level controlled Vital Signs Assessment: post-procedure vital signs reviewed and stable Respiratory status: spontaneous breathing, nonlabored ventilation, respiratory function stable and patient connected to nasal cannula oxygen Cardiovascular status: blood pressure returned to baseline and stable Postop Assessment: no apparent nausea or vomiting Anesthetic complications: no   No notable events documented.  Last Vitals:  Vitals:   04/19/21 1230 04/19/21 1245  BP: 103/74 107/72  Pulse: 84 82  Resp: 16 14  Temp:    SpO2: 98% 97%    Last Pain:  Vitals:   04/19/21 1215  TempSrc:   PainSc: 0-No pain                 Effie Berkshire

## 2021-04-19 NOTE — Progress Notes (Signed)
Neuro check pre-op. Pt has equal strength in bilateral hands and feet, tongue midline.

## 2021-04-19 NOTE — Op Note (Signed)
NAME: Martin Lee    MRN: [id number] DOB: 1971/01/30    DATE OF OPERATION: 04/19/2021  PREOP DIAGNOSIS:    Symptomatic right internal carotid artery stenosis  POSTOP DIAGNOSIS:    Same  PROCEDURE:    Right-sided carotid endarterectomy  SURGEON: Victorino Sparrow  ASSIST: Mosetta Pigeon, PA  ANESTHESIA: General  EBL: 50 mL  INDICATIONS:    Martin Lee is a 51 y.o. male with symptomatic right ICA stenosis. He reports no new symptoms overnight. CEA vs Carotid Stenting was discussed with him in full detail yesterday by Dr. Karin Lieu and decision was made to proceed with right CEA.   FINDINGS:   Greater than 80% stenosis of the right internal carotid artery Soft plaque with a ruptured mural thrombus  TECHNIQUE:   After full informed written consent was obtained from the patient, the patient was brought back to the operating room and placed supine upon the operating table.  Prior to induction, the patient received IV antibiotics.  After obtaining adequate anesthesia, the patient was placed into semi-Fowler position with a shoulder roll in place and the patient's neck slightly hyperextended and rotated away from the surgical site.  The patient was prepped in the standard fashion for a right carotid endarterectomy.    I made an incision anterior to the sternocleidomastoid muscle and dissected down through the subcutaneous tissue.  The platysmas was opened with electrocautery.  Then I dissected down to the internal jugular vein.  This was dissected posteriorly until I obtained visualization of the common carotid artery.  This was dissected out and then an umbilical tape was placed around the common carotid artery and I loosely applied a Rumel tourniquet.  I then dissected in a periadventitial fashion along the common carotid artery up to the bifurcation.  I then identified the external carotid artery and the superior thyroid artery.  A 2-0 silk tie was looped around the superior  thyroid artery, and I also dissected out the external carotid artery and placed a vessel loop around it.  In continuing the dissection to the internal carotid artery, I identified the facial vein.  This was ligated and then transected, giving me improved exposure of the internal carotid artery.  In the process of this dissection, the hypoglossal nerve was identified.  I then dissected out the internal carotid artery until I identified an area of soft tissue in the internal carotid artery.  I dissected slightly distal to this area, which was above the hypoglossal nerve, and placed an umbilical tape around the artery and loosely applied a Rumel tourniquet.  At this point, we gave the patient a therapeutic bolus of Heparin intravenously (roughly 100 units/kg).  After waiting 3 minutes, then I clamped the internal carotid artery, external carotid artery and then the common carotid artery.  I then made an arteriotomy in the common carotid artery with a 11 blade, and extended the arteriotomy with a Potts scissor down into the common carotid artery, then I carried the arteriotomy through the bifurcation into the internal carotid artery until I reached an area that was not diseased.  At this point, I took the 8 shunt that previously been prepared and I inserted it into the internal carotid artery.  The Rumel tourniquet was then applied to this end of the shunt.  I unclamped the shunt to verify retrograde blood flow in the internal carotid artery.  I then placed the other end of the shunt into the common carotid artery after unclamping the  artery.  The Rumel was tightened down around the shunt.  At this point, I verified blood flow in the shunt with a continuous doppler.  At this point, I started the endarterectomy in the common carotid artery with a Cytogeneticist and carried this dissection down into the common carotid artery circumferentially.  Then I transected the plaque at a segment where it was adherent.  I then  carried this dissection up into the external carotid artery.  The plaque was extracted by unclamping the external carotid artery and everting the artery.  The dissection was then carried into the internal carotid artery, extracting the remaining portion of the carotid plaque.  I passed the plaque off the field as a specimen.  I then spent the next 30 minutes removing intimal flaps and loose debris.  Eventually I reached the point where the residual plaque was densely adherent and any further dissection would compromise the integrity of the wall.  After verifying that there was no more loose intimal flaps or debris, I re-interrogated the entirety of this carotid artery.  At this point, I was satisfied that the minimal remaining disease was densely adherent to the wall and wall integrity was intact.  At this point, I then fashioned a bovine pericardial patch for the geometry of this artery and sewed it in place with two running stitch of 6-0 Prolene, one from each end.  Prior to completing this patch angioplasty, I removed the shunt first from the internal carotid artery, from which there was excellent backbleeding, and clamped it.  Then I removed the shunt from the common carotid artery, from which there was excellent antegrade bleeding, and then clamped it.  At this point, I allowed the external carotid artery to backbleed, which was excellent.  Then I instilled heparinized saline in this patched artery and then completed the patch angioplasty in the usual fashion.  First, I released the clamp on the external carotid artery, then I released it on the common carotid artery.  After waiting a few seconds, I then released it on the internal carotid artery.  I then interrogated this patient's arteries with the continuous Doppler.  The audible waveforms in each artery were consistent with the expected characteristics for each artery.  The Sonosite probe was then sterilely draped and used to interrogate the carotid artery  in both longitudinal and transverse views.  At this point, I washed out the wound, and placed thrombin and Gelfoam throughout.  I also gave the patient 50 mg of protamine to reverse his anticoagulation.   After waiting a few minutes, I removed the thrombin and Gelfoam and washed out the wound.  There was no more active bleeding in the surgical site.  An 8 [place] Blake drain was placed in the surgical wound. I then reapproximated the platysma muscle with a running stitch of 3-0 Vicryl.  The skin was then reapproximated with a running subcuticular 4-0 Monocryl stitch.  The skin was then cleaned, dried and Dermabond was used to reinforce the skin closure.  The patient woke without any problems, neurologically intact.     COMPLICATIONS: None  Given the complexity of the case a first assistant was necessary in order to expedient the procedure and safely perform the technical aspects of the operation.  Ladonna Snide, MD [facility]  DATE OF DICTATION:   04/19/2021

## 2021-04-19 NOTE — Progress Notes (Signed)
°  Progress Note   Day of Surgery  51 year old male with symptomatic right ICA stenosis.  He reports no new symptoms overnight. Doing well this morning.  Physical Exam Constitutional:      General: He is not in acute distress.    Appearance: Normal appearance.  HENT:     Head: Normocephalic.     Nose: No congestion.  Cardiovascular:     Rate and Rhythm: Normal rate and regular rhythm.  Pulmonary:     Effort: No respiratory distress.  Abdominal:     General: There is no distension.     Palpations: Abdomen is soft.  Musculoskeletal:        General: No swelling or tenderness.     Cervical back: No rigidity.  Skin:    General: Skin is warm and dry.  Neurological:     General: No focal deficit present.     Mental Status: He is alert and oriented to person, place, and time.     Cranial Nerves: No cranial nerve deficit.     Sensory: No sensory deficit.     Motor: No weakness.     Coordination: Coordination normal.    CEA vs Carotid Stenting was discussed with him in full. Due to his young age the decision was made to proceed with right CEA. He has no further questions today.  After discussing the risks and benefits of right CEA, Martin Lee elected to proceed.    Martin Sparrow, MD [facility] [phone] 04/19/2021 7:21 AM

## 2021-04-19 NOTE — Anesthesia Procedure Notes (Signed)
Procedure Name: Intubation Date/Time: 04/19/2021 7:42 AM Performed by: Clearnce Sorrel, CRNA Pre-anesthesia Checklist: Patient identified, Emergency Drugs available, Suction available and Patient being monitored Patient Re-evaluated:Patient Re-evaluated prior to induction Oxygen Delivery Method: Circle System Utilized Preoxygenation: Pre-oxygenation with 100% oxygen Induction Type: IV induction Ventilation: Mask ventilation without difficulty Laryngoscope Size: Mac and 4 Grade View: Grade I Tube type: Oral Tube size: 7.5 mm Number of attempts: 1 Airway Equipment and Method: Stylet and Oral airway Placement Confirmation: ETT inserted through vocal cords under direct vision, positive ETCO2 and breath sounds checked- equal and bilateral Secured at: 23 cm Tube secured with: Tape Dental Injury: Teeth and Oropharynx as per pre-operative assessment

## 2021-04-19 NOTE — Anesthesia Procedure Notes (Signed)
Arterial Line Insertion Start/End1/01/2022 7:20 AM  Patient location: Pre-op. Preanesthetic checklist: patient identified, IV checked, site marked, risks and benefits discussed, surgical consent, monitors and equipment checked, pre-op evaluation, timeout performed and anesthesia consent Lidocaine 1% used for infiltration Left, radial was placed Catheter size: 20 G Hand hygiene performed  and maximum sterile barriers used   Attempts: 1 Procedure performed without using ultrasound guided technique. Following insertion, dressing applied. Post procedure assessment: normal and unchanged

## 2021-04-19 NOTE — Progress Notes (Signed)
Pt Scheduled for right CEA, OR Staff came to pick up pt at 0640, report called to Glo Herring at [phone]. Obasogie-Asidi, Ghalia Reicks Efe

## 2021-04-19 NOTE — Anesthesia Preprocedure Evaluation (Addendum)
Anesthesia Evaluation  Patient identified by MRN, date of birth, ID band Patient awake    Reviewed: Allergy & Precautions, NPO status , Patient's Chart, lab work & pertinent test results  Airway Mallampati: I  TM Distance: >3 FB Neck ROM: Full    Dental  (+) Edentulous Upper, Edentulous Lower   Pulmonary Current Smoker,    breath sounds clear to auscultation       Cardiovascular + CAD and + CABG   Rhythm:Regular Rate:Normal  Echo: 1. Basal inferio/inferolateral hypokinesis. . Left ventricular ejection  fraction, by estimation, is 50 to 55%. The left ventricle has low normal  function. The left ventricular internal cavity size was moderately  dilated.  2. Right ventricular systolic function is normal. The right ventricular  size is normal.  3. The mitral valve is normal in structure. Trivial mitral valve  regurgitation.  4. The aortic valve is normal in structure. Aortic valve regurgitation is  not visualized.    Neuro/Psych CVA negative psych ROS   GI/Hepatic negative GI ROS, Neg liver ROS,   Endo/Other  diabetes  Renal/GU negative Renal ROS     Musculoskeletal negative musculoskeletal ROS (+)   Abdominal Normal abdominal exam  (+)   Peds  Hematology negative hematology ROS (+)   Anesthesia Other Findings   Reproductive/Obstetrics                            Anesthesia Physical Anesthesia Plan  ASA: 3  Anesthesia Plan: General   Post-op Pain Management:    Induction: Intravenous  PONV Risk Score and Plan: 2 and Ondansetron and Midazolam  Airway Management Planned: Oral ETT  Additional Equipment: Arterial line  Intra-op Plan:   Post-operative Plan: Extubation in OR  Informed Consent: I have reviewed the patients History and Physical, chart, labs and discussed the procedure including the risks, benefits and alternatives for the proposed anesthesia with the patient or  authorized representative who has indicated his/her understanding and acceptance.       Plan Discussed with: CRNA  Anesthesia Plan Comments: (Remi gtt. )       Anesthesia Quick Evaluation

## 2021-04-19 NOTE — TOC CAGE-AID Note (Signed)
Transition of Care [facility]) - CAGE-AID Screening   Patient Details  Name: Martin Lee MRN: [id number] Date of Birth: 1970/07/26  Transition of Care [facility]) CM/SW Contact:    Diyari Cherne C Tarpley-Carter, LCSWA Phone Number: 04/19/2021, 12:54 PM   Clinical Narrative: Pt participated in Cage-Aid.  Pt stated he does not use substance or ETOH.   Pt stated he smokes.  Pt was offered resources, due to no usage of substance.     [name], MSW, LCSW-A Pronouns:  She/Her/Hers [place] Transitions of Care Clinical Social Worker Direct Number:  [phone] [email or web address]  CAGE-AID Screening:    Have You Ever Felt You Ought to Cut Down on Your Drinking or Drug Use?: No Have People Annoyed You By [employer] Your Drinking Or Drug Use?: No Have You Felt Bad Or Guilty About Your Drinking Or Drug Use?: No Have You Ever Had a Drink or Used Drugs First Thing In The Morning to Steady Your Nerves or to Get Rid of a Hangover?: No CAGE-AID Score: 0  Substance Abuse Education Offered: Yes  Substance abuse interventions: Transport planner

## 2021-04-20 ENCOUNTER — Encounter (HOSPITAL_COMMUNITY): Payer: Self-pay | Admitting: Vascular Surgery

## 2021-04-20 DIAGNOSIS — I6521 Occlusion and stenosis of right carotid artery: Secondary | ICD-10-CM

## 2021-04-20 LAB — CBC WITH DIFFERENTIAL/PLATELET
Abs Immature Granulocytes: 0.03 10*3/uL (ref 0.00–0.07)
Basophils Absolute: 0 10*3/uL (ref 0.0–0.1)
Basophils Relative: 0 %
Eosinophils Absolute: 0.1 10*3/uL (ref 0.0–0.5)
Eosinophils Relative: 1 %
HCT: 40 % (ref 39.0–52.0)
Hemoglobin: 14 g/dL (ref 13.0–17.0)
Immature Granulocytes: 0 %
Lymphocytes Relative: 29 %
Lymphs Abs: 3.2 10*3/uL (ref 0.7–4.0)
MCH: 33.5 pg (ref 26.0–34.0)
MCHC: 35 g/dL (ref 30.0–36.0)
MCV: 95.7 fL (ref 80.0–100.0)
Monocytes Absolute: 1.1 10*3/uL — ABNORMAL HIGH (ref 0.1–1.0)
Monocytes Relative: 10 %
Neutro Abs: 6.7 10*3/uL (ref 1.7–7.7)
Neutrophils Relative %: 60 %
Platelets: 143 10*3/uL — ABNORMAL LOW (ref 150–400)
RBC: 4.18 MIL/uL — ABNORMAL LOW (ref 4.22–5.81)
RDW: 12 % (ref 11.5–15.5)
WBC: 11.2 10*3/uL — ABNORMAL HIGH (ref 4.0–10.5)
nRBC: 0 % (ref 0.0–0.2)

## 2021-04-20 LAB — COMPREHENSIVE METABOLIC PANEL
ALT: 21 U/L (ref 0–44)
AST: 17 U/L (ref 15–41)
Albumin: 3 g/dL — ABNORMAL LOW (ref 3.5–5.0)
Alkaline Phosphatase: 70 U/L (ref 38–126)
Anion gap: 6 (ref 5–15)
BUN: 11 mg/dL (ref 6–20)
CO2: 24 mmol/L (ref 22–32)
Calcium: 7.9 mg/dL — ABNORMAL LOW (ref 8.9–10.3)
Chloride: 107 mmol/L (ref 98–111)
Creatinine, Ser: 0.64 mg/dL (ref 0.61–1.24)
GFR, Estimated: >60 mL/min (ref >60)
Glucose, Bld: 143 mg/dL — ABNORMAL HIGH (ref 70–99)
Potassium: 3.7 mmol/L (ref 3.5–5.1)
Sodium: 137 mmol/L (ref 135–145)
Total Bilirubin: 0.8 mg/dL (ref 0.3–1.2)
Total Protein: 5.5 g/dL — ABNORMAL LOW (ref 6.5–8.1)

## 2021-04-20 LAB — GLUCOSE, CAPILLARY
Glucose-Capillary: 161 mg/dL — ABNORMAL HIGH (ref 70–99)
Glucose-Capillary: 133 mg/dL — ABNORMAL HIGH (ref 70–99)

## 2021-04-20 LAB — MAGNESIUM: Magnesium: 1.9 mg/dL (ref 1.7–2.4)

## 2021-04-20 MED ORDER — CLOPIDOGREL BISULFATE 75 MG PO TABS: 75.0000 mg | ORAL_TABLET | Freq: Every day | ORAL | 0 refills | Status: AC

## 2021-04-20 MED ORDER — ASPIRIN 81 MG PO CHEW: 81.0000 mg | CHEWABLE_TABLET | Freq: Every day | ORAL | 0 refills | Status: DC

## 2021-04-20 NOTE — Progress Notes (Signed)
Pt advised to call office about work note.   Kalman Jewels, RN 04/20/2021 12:44 PM

## 2021-04-20 NOTE — Discharge Summary (Signed)
Physician Discharge Summary  Martin Lee [id number] DOB: 1970/06/05 DOA: 04/16/2021  PCP: Adaline Sill, NP  Admit date: 04/16/2021 Discharge date: 04/20/2021  Admitted From: Home Disposition: Home  Recommendations for Outpatient Follow-up:  Follow up with PCP in 1 week with repeat CBC/BMP Outpatient follow-up with vascular surgery and neurology Follow up in ED if symptoms worsen or new appear   Home Health: No Equipment/Devices: None  Discharge Condition: Stable CODE STATUS: Full Diet recommendation: Heart healthy/carb modified  Brief/Interim Summary: 51 y.o. male with medical history significant of CAD s/p CABG x4 in 2018, DM type II, SBO, and tobacco abuse presented with right eye vision change.  On presentation, he was diagnosed with right ICA stenosis, right frontal lobe stroke and right retinal artery occlusion.  Neurology and vascular surgery were consulted.  Neurology recommended aspirin, Plavix and statin and outpatient follow-up with neurology.  He underwent right CEA on 04/19/2021; drain was removed on 04/20/2021 and vascular surgery has cleared him for discharge.  He will be discharged home today with outpatient follow-up with PCP/neurology and vascular surgery.  Discharge Diagnoses:   Acute right eye vision loss Right CRAO with right frontal lobe with small punctate infarcts Right ICA stenosis -Neurology and vascular surgery were consulted.  Neurology recommended aspirin, Plavix and statin and outpatient follow-up with neurology.  He underwent right CEA on 04/19/2021; drain was removed on 04/20/2021 and vascular surgery has cleared him for discharge.  He will be discharged home today with outpatient follow-up with PCP/neurology and vascular surgery. -He has been seen by ophthalmology in the office already.  Outpatient follow-up with ophthalmology.    Hypertension -Continue to allow for permissive hypertension for now and antihypertensives can be resumed by PCP as an  outpatient during follow-up visit.  Diabetes mellitus type 2 -Resume Home Regimen.  Carb Modified Diet  Dyslipidemia -Will Continue Lipitor 40 mg daily  Tobacco abuse -Patient was counseled regarding tobacco cessation by prior hospitalist  CAD with CABG history -Chest pain-free.  Continue aspirin, and statin.  Beta-blocker on hold for now.  Outpatient follow-up with cardiology.  Discharge Instructions  Discharge Instructions     Ambulatory referral to Neurology   Complete by: As directed    Follow up with stroke clinic NP (Jessica Vanschaick or Cecille Rubin, if both not available, consider Zachery Dauer, or Ahern) at [facility] in about 4 weeks. Thanks.   Diet - low sodium heart healthy   Complete by: As directed    Diet general   Complete by: As directed    Discharge wound care:   Complete by: As directed    As per vascular surgery recommendations   Increase activity slowly   Complete by: As directed    No wound care   Complete by: As directed       Allergies as of 04/20/2021   No Known Allergies      Medication List     STOP taking these medications    aspirin 325 MG EC tablet Replaced by: aspirin 81 MG chewable tablet   metoprolol tartrate 50 MG tablet Commonly known as: LOPRESSOR       TAKE these medications    ALPRAZolam 1 MG tablet Commonly known as: XANAX Take 1 mg by mouth 2 (two) times daily.   aspirin 81 MG chewable tablet Chew 1 tablet (81 mg total) by mouth daily. Replaces: aspirin 325 MG EC tablet   atorvastatin 40 MG tablet Commonly known as: LIPITOR Take 40 mg by mouth every evening.  clopidogrel 75 MG tablet Commonly known as: PLAVIX Take 1 tablet (75 mg total) by mouth daily.   empagliflozin 25 MG Tabs tablet Commonly known as: JARDIANCE Take 25 mg by mouth daily.   escitalopram 10 MG tablet Commonly known as: LEXAPRO Take 10 mg by mouth daily.   metFORMIN 1000 MG tablet Commonly known as: GLUCOPHAGE Take 1,000 mg by  mouth 2 (two) times daily with a meal.               Discharge Care Instructions  (From admission, onward)           Start     Ordered   04/20/21 0000  Discharge wound care:       Comments: As per vascular surgery recommendations   04/20/21 0957            Follow-up Information     [facility]. Schedule an appointment as soon as possible for a visit in 1 month(s).   Specialty: Neurology Why: stroke clinic Contact information: [address] [place] [address] [phone]        [facility] Follow up in 2 week(s).   Specialty: Vascular Surgery Contact information: [address] [place] [address] [phone]        Rebekah Chesterfield, NP. Schedule an appointment as soon as possible for a visit in 1 week(s).   Specialty: Internal Medicine Contact information: [address] [place] [address] [phone]                No Known Allergies  Consultations: Neurology/vascular surgery   Procedures/Studies: CT ANGIO HEAD NECK W WO CM  Result Date: 04/16/2021 CLINICAL DATA:  Acute neurologic deficit. EXAM: CT ANGIOGRAPHY HEAD AND NECK TECHNIQUE: Multidetector CT imaging of the head and neck was performed using the standard protocol during bolus administration of intravenous contrast. Multiplanar CT image reconstructions and MIPs were obtained to evaluate the vascular anatomy. Carotid stenosis measurements (when applicable) are obtained utilizing NASCET criteria, using the distal internal carotid diameter as the denominator. CONTRAST:  52mL OMNIPAQUE IOHEXOL 350 MG/ML SOLN COMPARISON:  Brain MRI/MRA 04/16/2021 FINDINGS: CT HEAD FINDINGS Brain: No hemorrhage or extra-axial collection. Pericallosal lipoma again noted. The size and configuration of the ventricles and extra-axial CSF spaces are normal. There is no acute or chronic infarction. The brain  parenchyma is normal. Skull: The visualized skull base, calvarium and extracranial soft tissues are normal. Sinuses/Orbits: No fluid levels or advanced mucosal thickening of the visualized paranasal sinuses. No mastoid or middle ear effusion. The orbits are normal. CTA NECK FINDINGS SKELETON: There is no bony spinal canal stenosis. No lytic or blastic lesion. OTHER NECK: Normal pharynx, larynx and major salivary glands. No cervical lymphadenopathy. Unremarkable thyroid gland. UPPER CHEST: No pneumothorax or pleural effusion. No nodules or masses. AORTIC ARCH: There is calcific atherosclerosis of the aortic arch. There is no aneurysm, dissection or hemodynamically significant stenosis of the visualized portion of the aorta. Conventional 3 vessel aortic branching pattern. The visualized proximal subclavian arteries are widely patent. RIGHT CAROTID SYSTEM: No dissection, occlusion or aneurysm. There is predominantly low density atherosclerosis extending into the proximal ICA, resulting in 50% stenosis. LEFT CAROTID SYSTEM: No dissection, occlusion or aneurysm. There is predominantly low density atherosclerosis extending into the proximal ICA, resulting in less than 50% stenosis. VERTEBRAL ARTERIES: Codominant configuration. Both origins are clearly patent. There is no dissection, occlusion or flow-limiting stenosis to the skull base (V1-V3  segments). CTA HEAD FINDINGS POSTERIOR CIRCULATION: --Vertebral arteries: Bilateral atherosclerotic calcification with moderate stenosis, but both remain patent. --Inferior cerebellar arteries: Normal. --Basilar artery: Normal. --Superior cerebellar arteries: Normal. --Posterior cerebral arteries (PCA): Normal. ANTERIOR CIRCULATION: --Intracranial internal carotid arteries: Normal. --Anterior cerebral arteries (ACA): Normal. Both A1 segments are present. Patent anterior communicating artery (a-comm). --Middle cerebral arteries (MCA): Normal. VENOUS SINUSES: As permitted by contrast  timing, patent. ANATOMIC VARIANTS: None Review of the MIP images confirms the above findings. IMPRESSION: 1. No emergent large vessel occlusion. 2. Bilateral proximal ICA plaque causing 50% stenosis on the right and no hemodynamically significant stenosis on the left. 3. Moderate bilateral vertebral artery V4 segment atherosclerotic calcification with moderate stenosis, but maintained patency. 4. Pericallosal lipoma, unchanged. Aortic atherosclerosis (ICD10-I70.0). Electronically Signed   By: Ulyses Jarred M.D.   On: 04/16/2021 21:13   CT HEAD WO CONTRAST (5MM)  Result Date: 04/15/2021 CLINICAL DATA:  Neuro deficit, acute stroke suspected. Reported to be unable to see out of his left eye upon awakening today. EXAM: CT HEAD WITHOUT CONTRAST TECHNIQUE: Contiguous axial images were obtained from the base of the skull through the vertex without intravenous contrast. COMPARISON:  None. FINDINGS: Brain: There is no evidence of acute intracranial hemorrhage, mass lesion, brain edema or extra-axial fluid collection. The ventricles and subarachnoid spaces are appropriately sized for age. There is no CT evidence of acute cortical infarction. There is a large curvilinear pericallosal lipoma, measuring up to 5.4 cm in length on image 31/6. Vascular: Intracranial vascular calcifications. No hyperdense vessel identified. Skull: Negative for fracture or focal lesion. Sinuses/Orbits: Probable small mucous retention cyst inferiorly in the left maxillary sinus. The additional visualized paranasal sinuses, mastoid air cells and middle ears are clear. Other: None. IMPRESSION: 1. No acute intracranial findings demonstrated. No CT evidence of acute stroke or hemorrhage. 2. Incidental large curvilinear pericallosal lipoma. Electronically Signed   By: Richardean Sale M.D.   On: 04/15/2021 13:01   MR ANGIO HEAD WO CONTRAST  Result Date: 04/16/2021 CLINICAL DATA:  Neuro deficit, acute, stroke suspected R vision loss; Neuro deficit,  acute, stroke suspected EXAM: MRA NECK WITHOUT CONTRAST MRA HEAD WITHOUT CONTRAST TECHNIQUE: Angiographic images of the Circle of Willis were acquired using MRA technique without intravenous contrast. COMPARISON:  None. FINDINGS: MRA NECK FINDINGS Right carotid system: Limited evaluation proximally due to technique/artifact. The visible common carotid artery appear patent without significant stenosis. Limited evaluation at the carotid bifurcation on time-of-flight due to motion. On postcontrast imaging there appears to be moderate to severe stenosis of the proximal ICA. Left carotid system: Limited evaluation proximally due to artifact/technique. The visible common carotid artery appears patent without significant stenosis. At the carotid bifurcation there is probably mild stenosis. Vertebral arteries: Limited evaluation of the vertebral artery origins and proximal vertebral arteries due to artifact/technique. The visualized vertebral arteries are patent bilaterally without evidence of significant (greater than 50%) stenosis. MRA HEAD FINDINGS Anterior circulation: Bilateral intracranial ICAs, MCAs and ACAs are patent without proximal hemodynamically significant stenosis. No aneurysm identified. Posterior circulation: Visualized intradural vertebral arteries, basilar artery, and posterior cerebral arteries are patent without proximal hemodynamically significant stenosis. Right posterior communicating artery noted. No aneurysm identified. IMPRESSION: MRA neck: 1. Limited study with suspected moderate to severe stenosis of the proximal right ICA. A CTA could provide more confident quantitative assessment if clinically indicated. 2. Probably mild stenosis of the left proximal ICA. MRA head: No large vessel occlusion or proximal hemodynamically significant stenosis. Electronically Signed   By: Albertina Parr  Adah Salvage M.D.   On: 04/16/2021 15:27   MR Angiogram Neck W or Wo Contrast  Result Date: 04/16/2021 CLINICAL DATA:   Neuro deficit, acute, stroke suspected R vision loss; Neuro deficit, acute, stroke suspected EXAM: MRA NECK WITHOUT CONTRAST MRA HEAD WITHOUT CONTRAST TECHNIQUE: Angiographic images of the Circle of Willis were acquired using MRA technique without intravenous contrast. COMPARISON:  None. FINDINGS: MRA NECK FINDINGS Right carotid system: Limited evaluation proximally due to technique/artifact. The visible common carotid artery appear patent without significant stenosis. Limited evaluation at the carotid bifurcation on time-of-flight due to motion. On postcontrast imaging there appears to be moderate to severe stenosis of the proximal ICA. Left carotid system: Limited evaluation proximally due to artifact/technique. The visible common carotid artery appears patent without significant stenosis. At the carotid bifurcation there is probably mild stenosis. Vertebral arteries: Limited evaluation of the vertebral artery origins and proximal vertebral arteries due to artifact/technique. The visualized vertebral arteries are patent bilaterally without evidence of significant (greater than 50%) stenosis. MRA HEAD FINDINGS Anterior circulation: Bilateral intracranial ICAs, MCAs and ACAs are patent without proximal hemodynamically significant stenosis. No aneurysm identified. Posterior circulation: Visualized intradural vertebral arteries, basilar artery, and posterior cerebral arteries are patent without proximal hemodynamically significant stenosis. Right posterior communicating artery noted. No aneurysm identified. IMPRESSION: MRA neck: 1. Limited study with suspected moderate to severe stenosis of the proximal right ICA. A CTA could provide more confident quantitative assessment if clinically indicated. 2. Probably mild stenosis of the left proximal ICA. MRA head: No large vessel occlusion or proximal hemodynamically significant stenosis. Electronically Signed   By: Margaretha Sheffield M.D.   On: 04/16/2021 15:27   MR BRAIN  WO CONTRAST  Result Date: 04/16/2021 CLINICAL DATA:  Neuro deficit, acute, stroke suspected EXAM: MRI HEAD WITHOUT CONTRAST TECHNIQUE: Multiplanar, multiecho pulse sequences of the brain and surrounding structures were obtained without intravenous contrast. COMPARISON:  Same day CT head. FINDINGS: Brain: Multiple small foci of restricted diffusion in the high right frontal cortex, suspicious for acute infarcts. Small areas of cortical T2 hyperintensity without restricted diffusion in the left parietal cortex, compatible with prior infarcts. Remote lacunar infarct in the left caudate. Scattered small T2/FLAIR hyperintensities in the white matter, nonspecific but compatible with mild chronic microvascular ischemic disease. Discrete T2 hyperintensities in the right greater than left basal ganglia are compatible with benign dilated perivascular spaces. No evidence of acute hemorrhage, hydrocephalus, mass lesion, midline shift or extra-axial fluid collection. Pericallosal lipoma, as seen on same day CT head. Cavum septum pellucidum et vergae, anatomic variant. Vascular: See forthcoming MRA. Skull and upper cervical spine: Normal marrow signal. Sinuses/Orbits: Mild paranasal sinus mucosal thickening. Unremarkable orbits. Other: Trace mastoid effusions. IMPRESSION: 1. Multiple small foci of restricted diffusion in the high right frontal cortex, suspicious for acute infarcts. Mild associated edema without mass effect. 2. 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 3. 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 Electronically Signed   By: Margaretha Sheffield M.D.   On: 04/16/2021 14:40   ECHOCARDIOGRAM COMPLETE BUBBLE STUDY  Result Date: 04/16/2021    ECHOCARDIOGRAM REPORT   Patient Name:   Martin Lee Date of Exam: 04/16/2021 Medical Rec #:  [id number]    Height: Accession #:    [id number]   Weight: Date of Birth:  [birth year]    BSA: Patient Age:    85 years      BP:           118/84  mmHg Patient Gender: M            HR:           68 bpm. Exam Location:  Inpatient Procedure: 2D Echo, Cardiac Doppler, Color Doppler and Saline Contrast Bubble            Study Indications:    Stroke  History:        Patient has no prior history of Echocardiogram examinations.                 CAD; Prior CABG.  Sonographer:    Merrie Roof RDCS Referring Phys: [place]  1. Basal inferio/inferolateral hypokinesis. . Left ventricular ejection fraction, by estimation, is 50 to 55%. The left ventricle has low normal function. The left ventricular internal cavity size was moderately dilated.  2. Right ventricular systolic function is normal. The right ventricular size is normal.  3. The mitral valve is normal in structure. Trivial mitral valve regurgitation.  4. The aortic valve is normal in structure. Aortic valve regurgitation is not visualized. FINDINGS  Left Ventricle: Basal inferio/inferolateral hypokinesis. Left ventricular ejection fraction, by estimation, is 50 to 55%. The left ventricle has low normal function. The left ventricular internal cavity size was moderately dilated. There is no left ventricular hypertrophy. Right Ventricle: The right ventricular size is normal. Right vetricular wall thickness was not assessed. Right ventricular systolic function is normal. Left Atrium: Left atrial size was normal in size. Right Atrium: Right atrial size was normal in size. Pericardium: There is no evidence of pericardial effusion. Mitral Valve: The mitral valve is normal in structure. Trivial mitral valve regurgitation. Tricuspid Valve: The tricuspid valve is normal in structure. Tricuspid valve regurgitation is trivial. Aortic Valve: The aortic valve is normal in structure. Aortic valve regurgitation is not visualized. Aortic valve mean gradient measures 3.0 mmHg. Aortic valve peak gradient measures 5.5 mmHg. Aortic valve area, by VTI measures 1.90 cm. Pulmonic Valve:  The pulmonic valve was normal in structure. Pulmonic valve regurgitation is not visualized. Aorta: The aortic root is normal in size and structure. IAS/Shunts: No atrial level shunt detected by color flow Doppler. Agitated saline contrast was given intravenously to evaluate for intracardiac shunting.  LEFT VENTRICLE PLAX 2D LVIDd:         5.60 cm   Diastology LVIDs:         4.00 cm   LV e' medial:    8.38 cm/s LV PW:         0.90 cm   LV E/e' medial:  10.1 LV IVS:        0.70 cm   LV e' lateral:   10.20 cm/s LVOT diam:     2.00 cm   LV E/e' lateral: 8.3 LV SV:         43 LVOT Area:     3.14 cm  RIGHT VENTRICLE RV Basal diam:  3.40 cm RV S prime:     8.27 cm/s TAPSE (M-mode): 1.4 cm LEFT ATRIUM             RIGHT ATRIUM LA diam:        4.30 cm RA Area:     13.00 cm LA Vol (A2C):   35.1 ml RA Volume:   30.70 ml LA Vol (A4C):   26.8 ml LA Biplane Vol: 30.6 ml  AORTIC VALVE AV Area (Vmax):    1.74 cm AV Area (Vmean):   1.66 cm AV Area (VTI):  1.90 cm AV Vmax:           117.00 cm/s AV Vmean:          78.700 cm/s AV VTI:            0.226 m AV Peak Grad:      5.5 mmHg AV Mean Grad:      3.0 mmHg LVOT Vmax:         64.70 cm/s LVOT Vmean:        41.600 cm/s LVOT VTI:          0.137 m LVOT/AV VTI ratio: 0.61  AORTA Ao Root diam: 3.30 cm Ao Asc diam:  3.30 cm MITRAL VALVE MV Area (PHT): 4.80 cm    SHUNTS MV Decel Time: 158 msec    Systemic VTI:  0.14 m MV E velocity: 84.80 cm/s  Systemic Diam: 2.00 cm MV A velocity: 65.60 cm/s MV E/A ratio:  1.29 Dorris Carnes MD Electronically signed by Dorris Carnes MD Signature Date/Time: 04/16/2021/2:11:04 PM    Final    VAS US CAROTID  Result Date: 04/18/2021 Carotid Arterial Duplex Study Patient Name:  JASPAL MULLAN  Date of Exam:   04/17/2021 Medical Rec #: [id number]     Accession #:    [id number] Date of Birth: [birth year]     Patient Gender: M Patient Age:   75 years Exam Location:  [facility] Procedure:      VAS US CAROTID Referring Phys: Vonna Kotyk ROBINS  --------------------------------------------------------------------------------  Indications:       CVA, Visual disturbance and right retinal artery occlusion. Risk Factors:      Hypertension, hyperlipidemia, Diabetes, current smoker,                    coronary artery disease (CABG 2018). Other Factors:     50% stenosis of the right ICA noted on CTA. Comparison Study:  No prior study Performing Technologist: Sharion Dove RVS  Examination Guidelines: A complete evaluation includes B-mode imaging, spectral Doppler, color Doppler, and power Doppler as needed of all accessible portions of each vessel. Bilateral testing is considered an integral part of a complete examination. Limited examinations for reoccurring indications may be performed as noted.  Right Carotid Findings: +----------+--------+--------+--------+------------------+------------------+             PSV cm/s EDV cm/s Stenosis Plaque Description Comments            +----------+--------+--------+--------+------------------+------------------+  CCA Prox   76       21                                   intimal thickening  +----------+--------+--------+--------+------------------+------------------+  CCA Distal 69       20                                   intimal thickening  +----------+--------+--------+--------+------------------+------------------+  ICA Prox   152      69       40-59%   heterogenous                           +----------+--------+--------+--------+------------------+------------------+  ICA Mid    156      57                                                       +----------+--------+--------+--------+------------------+------------------+  ICA Distal 151      54                                                       +----------+--------+--------+--------+------------------+------------------+  ECA        102      19                                                        +----------+--------+--------+--------+------------------+------------------+ +----------+--------+-------+--------+-------------------+             PSV cm/s EDV cms Describe Arm Pressure (mmHG)  +----------+--------+-------+--------+-------------------+  Subclavian 147                                            +----------+--------+-------+--------+-------------------+ +---------+--------+--+--------+--+  Vertebral PSV cm/s 62 EDV cm/s 20  +---------+--------+--+--------+--+  Left Carotid Findings: +----------+--------+--------+--------+------------------+------------------+             PSV cm/s EDV cm/s Stenosis Plaque Description Comments            +----------+--------+--------+--------+------------------+------------------+  CCA Prox   63       16                focal and calcific                     +----------+--------+--------+--------+------------------+------------------+  CCA Distal 81       26                                   intimal thickening  +----------+--------+--------+--------+------------------+------------------+  ICA Prox   47       19       1-39%    heterogenous                           +----------+--------+--------+--------+------------------+------------------+  ICA Mid    72       25                                                       +----------+--------+--------+--------+------------------+------------------+  ECA        63       14                homogeneous                            +----------+--------+--------+--------+------------------+------------------+ +----------+--------+--------+--------+-------------------+             PSV cm/s EDV cm/s Describe Arm Pressure (mmHG)  +----------+--------+--------+--------+-------------------+  Subclavian 115                                             +----------+--------+--------+--------+-------------------+ +---------+--------+--+--------+--+  Vertebral PSV cm/s 35 EDV cm/s 10  +---------+--------+--+--------+--+   Summary: Right  Carotid: Velocities in the right ICA are consistent with a 40-59%                stenosis. Left Carotid: Velocities in the left ICA are consistent with a 1-39% stenosis. Vertebrals:  Bilateral vertebral arteries demonstrate antegrade flow. Subclavians: Normal flow hemodynamics were seen in bilateral subclavian              arteries. *See table(s) above for measurements and observations.  Electronically signed by Servando Snare MD on 04/18/2021 at 3:12:20 PM.    Final       Subjective: Patient seen and examined at bedside.  Feels much better and wants to go home today.  No overnight fever, vomiting, chest pain reported.  Discharge Exam: Vitals:   04/20/21 0330 04/20/21 0748  BP: 130/84 129/82  Pulse: 82 79  Resp: 16 14  Temp: 98.5 F (36.9 C) 98.4 F (36.9 C)  SpO2: 98% 97%    General: Pt is alert, awake, not in acute distress.  Currently on room air. Cardiovascular: rate controlled, S1/S2 + Respiratory: bilateral decreased breath sounds at bases Abdominal: Soft, NT, ND, bowel sounds + Extremities: Trace lower extremity edema; no cyanosis    The results of significant diagnostics from this hospitalization (including imaging, microbiology, ancillary and laboratory) are listed below for reference.     Microbiology: Recent Results (from the past 240 hour(s))  Resp Panel by RT-PCR (Flu A&B, Covid) Nasopharyngeal Swab     Status: None   Collection Time: 04/16/21  7:33 PM   Specimen: Nasopharyngeal Swab; Nasopharyngeal(NP) swabs in vial transport medium  Result Value Ref Range Status   SARS Coronavirus 2 by RT PCR NEGATIVE NEGATIVE Final    Comment: (NOTE) SARS-CoV-2 target nucleic acids are NOT DETECTED.  The SARS-CoV-2 RNA is generally detectable in upper respiratory specimens during the acute phase of infection. The lowest concentration of SARS-CoV-2 viral copies this assay can detect is 138 copies/mL. A negative result does not preclude SARS-Cov-2 infection and should not be used  as the sole basis for treatment or other patient management decisions. A negative result may occur with  improper specimen collection/handling, submission of specimen other than nasopharyngeal swab, presence of viral mutation(s) within the areas targeted by this assay, and inadequate number of viral copies(<138 copies/mL). A negative result must be combined with clinical observations, patient history, and epidemiological information. The expected result is Negative.  Fact Sheet for Patients:  EntrepreneurPulse.com.au  Fact Sheet for Healthcare Providers:  IncredibleEmployment.be  This test is no t yet approved or cleared by the [place] FDA and  has been authorized for detection and/or diagnosis of SARS-CoV-2 by FDA under an Emergency Use Authorization (EUA). This EUA will remain  in effect (meaning this test can be used) for the duration of the COVID-19 declaration under Section 564(b)(1) of the Act, 21 U.S.C.section 360bbb-3(b)(1), unless the authorization is terminated  or revoked sooner.       Influenza A by PCR NEGATIVE NEGATIVE Final   Influenza B by PCR NEGATIVE NEGATIVE Final    Comment: (NOTE) The Xpert Xpress SARS-CoV-2/FLU/RSV plus assay is intended as an aid in the diagnosis of influenza from Nasopharyngeal swab specimens and should not be used as a sole basis for treatment. Nasal washings and aspirates are unacceptable for Xpert Xpress SARS-CoV-2/FLU/RSV testing.  Fact Sheet for Patients: EntrepreneurPulse.com.au  Fact Sheet for Healthcare Providers: IncredibleEmployment.be  This test is not  yet approved or cleared by the [place] and has been authorized for detection and/or diagnosis of SARS-CoV-2 by FDA under an Emergency Use Authorization (EUA). This EUA will remain in effect (meaning this test can be used) for the duration of the COVID-19 declaration under Section 564(b)(1)  of the Act, 21 U.S.C. section 360bbb-3(b)(1), unless the authorization is terminated or revoked.  Performed at [facility], [place] [address] [place], [place] [address]   Surgical pcr screen     Status: Abnormal   Collection Time: 04/18/21  8:42 PM   Specimen: Nasal Mucosa; Nasal Swab  Result Value Ref Range Status   MRSA, PCR NEGATIVE NEGATIVE Final   Staphylococcus aureus POSITIVE (A) NEGATIVE Final    Comment: (NOTE) The Xpert SA Assay (FDA approved for NASAL specimens in patients 83 years of age and older), is one component of a comprehensive surveillance program. It is not intended to diagnose infection nor to guide or monitor treatment. Performed at [facility], [place] [address], [place], [place] [address]      Labs: BNP (last 3 results) No results for input(s): BNP in the last 8760 hours. Basic Metabolic Panel: Recent Labs  Lab 04/15/21 1252 04/15/21 1351 04/16/21 1037 04/17/21 0552 04/19/21 0201 04/20/21 0500  NA 136 139 138 135 136 137  K 4.6 4.4 5.0 4.6 4.2 3.7  CL 103 105 106 106 103 107  CO2 25  --  23 20* 25 24  GLUCOSE 119* 114* 139* 165* 141* 143*  BUN 10 12 11 11 14 11   CREATININE 0.76 0.70 0.78 0.94 0.92 0.64  CALCIUM 9.3  --  9.5 8.9 8.7* 7.9*  MG  --   --   --   --  2.0 1.9   Liver Function Tests: Recent Labs  Lab 04/15/21 1252 04/19/21 0201 04/20/21 0500  AST 32 26 17  ALT 30 31 21   ALKPHOS 96 78 70  BILITOT 0.9 0.6 0.8  PROT 7.4 6.6 5.5*  ALBUMIN 4.1 3.6 3.0*   No results for input(s): LIPASE, AMYLASE in the last 168 hours. No results for input(s): AMMONIA in the last 168 hours. CBC: Recent Labs  Lab 04/15/21 1252 04/15/21 1351 04/16/21 1037 04/17/21 0552 04/19/21 0201 04/20/21 0500  WBC 10.3  --  10.6* 9.9 8.6 11.2*  NEUTROABS 5.8  --   --   --  4.0 6.7  HGB 17.3* 17.7* 17.8* 16.9 16.9 14.0  HCT 51.4 52.0 51.0 49.0 48.8 40.0  MCV 97.9  --  96.2 96.3 94.8 95.7  PLT 195  --  194 181 164 143*   Cardiac  Enzymes: No results for input(s): CKTOTAL, CKMB, CKMBINDEX, TROPONINI in the last 168 hours. BNP: Invalid input(s): POCBNP CBG: Recent Labs  Lab 04/18/21 2108 04/19/21 0603 04/19/21 1043 04/19/21 2143 04/20/21 0635  GLUCAP 133* 191* 186* 247* 161*   D-Dimer No results for input(s): DDIMER in the last 72 hours. Hgb A1c No results for input(s): HGBA1C in the last 72 hours. Lipid Profile No results for input(s): CHOL, HDL, LDLCALC, TRIG, CHOLHDL, LDLDIRECT in the last 72 hours. Thyroid function studies No results for input(s): TSH, T4TOTAL, T3FREE, THYROIDAB in the last 72 hours.  Invalid input(s): FREET3 Anemia work up No results for input(s): VITAMINB12, FOLATE, FERRITIN, TIBC, IRON, RETICCTPCT in the last 72 hours. Urinalysis No results found for: COLORURINE, APPEARANCEUR, LABSPEC, [place], GLUCOSEU, HGBUR, BILIRUBINUR, KETONESUR, PROTEINUR, UROBILINOGEN, NITRITE, LEUKOCYTESUR Sepsis Labs Invalid input(s): PROCALCITONIN,  WBC,  [place] [facility]  Recent Results (from the past 240 hour(s))  Resp Panel by RT-PCR (Flu A&B, Covid) Nasopharyngeal Swab     Status: None   Collection Time: 04/16/21  7:33 PM   Specimen: Nasopharyngeal Swab; Nasopharyngeal(NP) swabs in vial transport medium  Result Value Ref Range Status   SARS Coronavirus 2 by RT PCR NEGATIVE NEGATIVE Final    Comment: (NOTE) SARS-CoV-2 target nucleic acids are NOT DETECTED.  The SARS-CoV-2 RNA is generally detectable in upper respiratory specimens during the acute phase of infection. The lowest concentration of SARS-CoV-2 viral copies this assay can detect is 138 copies/mL. A negative result does not preclude SARS-Cov-2 infection and should not be used as the sole basis for treatment or other patient management decisions. A negative result may occur with  improper specimen collection/handling, submission of specimen other than nasopharyngeal swab, presence of viral mutation(s) within the areas targeted  by this assay, and inadequate number of viral copies(<138 copies/mL). A negative result must be combined with clinical observations, patient history, and epidemiological information. The expected result is Negative.  Fact Sheet for Patients:  EntrepreneurPulse.com.au  Fact Sheet for Healthcare Providers:  IncredibleEmployment.be  This test is no t yet approved or cleared by the [place] FDA and  has been authorized for detection and/or diagnosis of SARS-CoV-2 by FDA under an Emergency Use Authorization (EUA). This EUA will remain  in effect (meaning this test can be used) for the duration of the COVID-19 declaration under Section 564(b)(1) of the Act, 21 U.S.C.section 360bbb-3(b)(1), unless the authorization is terminated  or revoked sooner.       Influenza A by PCR NEGATIVE NEGATIVE Final   Influenza B by PCR NEGATIVE NEGATIVE Final    Comment: (NOTE) The Xpert Xpress SARS-CoV-2/FLU/RSV plus assay is intended as an aid in the diagnosis of influenza from Nasopharyngeal swab specimens and should not be used as a sole basis for treatment. Nasal washings and aspirates are unacceptable for Xpert Xpress SARS-CoV-2/FLU/RSV testing.  Fact Sheet for Patients: EntrepreneurPulse.com.au  Fact Sheet for Healthcare Providers: IncredibleEmployment.be  This test is not yet approved or cleared by the [place] FDA and has been authorized for detection and/or diagnosis of SARS-CoV-2 by FDA under an Emergency Use Authorization (EUA). This EUA will remain in effect (meaning this test can be used) for the duration of the COVID-19 declaration under Section 564(b)(1) of the Act, 21 U.S.C. section 360bbb-3(b)(1), unless the authorization is terminated or revoked.  Performed at [facility], [place] [address], [place], [place] [address]   Surgical pcr screen     Status: Abnormal   Collection Time: 04/18/21  8:42  PM   Specimen: Nasal Mucosa; Nasal Swab  Result Value Ref Range Status   MRSA, PCR NEGATIVE NEGATIVE Final   Staphylococcus aureus POSITIVE (A) NEGATIVE Final    Comment: (NOTE) The Xpert SA Assay (FDA approved for NASAL specimens in patients 16 years of age and older), is one component of a comprehensive surveillance program. It is not intended to diagnose infection nor to guide or monitor treatment. Performed at [facility], [place] [address] [place], [place] [address]      Time coordinating discharge: 35 minutes  SIGNED:   Aline August, MD  [facility] 04/20/2021, 9:58 AM

## 2021-04-20 NOTE — Discharge Instructions (Signed)
   [facility]  Discharge Instructions   Carotid Endarterectomy (CEA)  Please refer to the following instructions for your post-procedure care. Your surgeon or physician assistant will discuss any changes with you.  Activity  You are encouraged to walk as much as you can. You can slowly return to normal activities but must avoid strenuous activity and heavy lifting until your doctor tell you it's OK. Avoid activities such as vacuuming or swinging a golf club. You can drive after one week if you are comfortable and you are no longer taking prescription pain medications. It is normal to feel tired for serval weeks after your surgery. It is also normal to have difficulty with sleep habits, eating, and bowel movements after surgery. These will go away with time.  Bathing/Showering  You may shower after you come home. Do not soak in a bathtub, hot tub, or swim until the incision heals completely.  Incision Care  Shower every day. Clean your incision with mild soap and water. Pat the area dry with a clean towel. You do not need a bandage unless otherwise instructed. Do not apply any ointments or creams to your incision. You may have skin glue on your incision. Do not peel it off. It will come off on its own in about one week. Your incision may feel thickened and raised for several weeks after your surgery. This is normal and the skin will soften over time. For Men Only: It's OK to shave around the incision but do not shave the incision itself for 2 weeks. It is common to have numbness under your chin that could last for several months.  Diet  Resume your normal diet. There are no special food restrictions following this procedure. A low fat/low cholesterol diet is recommended for all patients with vascular disease. In order to heal from your surgery, it is CRITICAL to get adequate nutrition. Your body requires vitamins, minerals, and protein. Vegetables are the best  source of vitamins and minerals. Vegetables also provide the perfect balance of protein. Processed food has little nutritional value, so try to avoid this.        Medications  Resume taking all of your medications unless your doctor or physician assistant tells you not to. If your incision is causing pain, you may take over-the- counter pain relievers such as acetaminophen (Tylenol). If you were prescribed a stronger pain medication, please be aware these medications can cause nausea and constipation. Prevent nausea by taking the medication with a snack or meal. Avoid constipation by drinking plenty of fluids and eating foods with a high amount of fiber, such as fruits, vegetables, and grains. Do not take Tylenol if you are taking prescription pain medications.  Follow Up  Our office will schedule a follow up appointment 2-3 weeks following discharge.  Please call us immediately for any of the following conditions  Increased pain, redness, drainage (pus) from your incision site. Fever of 101 degrees or higher. If you should develop stroke (slurred speech, difficulty swallowing, weakness on one side of your body, loss of vision) you should call 911 and go to the nearest emergency room.  Reduce your risk of vascular disease:  Stop smoking. If you would like help call [facility] at 1-800-QUIT-NOW (1-800-784-8669) or [place] at 336-586-4000. Manage your cholesterol Maintain a desired weight Control your diabetes Keep your blood pressure down  If you have any questions, please call the office at [phone].   

## 2021-04-20 NOTE — Progress Notes (Addendum)
°  Progress Note    04/20/2021 7:46 AM 1 Day Post-Op  Subjective:  Denies any stroke like symptoms overnight.  No further vision changes.   Vitals:   04/19/21 2304 04/20/21 0330  BP: 112/78 130/84  Pulse: 66 82  Resp: 16 16  Temp: 98 F (36.7 C) 98.5 F (36.9 C)  SpO2: 97% 98%   Physical Exam: Lungs:  non labored Incisions:  R neck incision c/d/i Extremities:  moving all extremities well Neurologic: CN grossly intact; R eye vision deficit is stable  CBC    Component Value Date/Time   WBC 11.2 (H) 04/20/2021 0500   RBC 4.18 (L) 04/20/2021 0500   HGB 14.0 04/20/2021 0500   HCT 40.0 04/20/2021 0500   PLT 143 (L) 04/20/2021 0500   MCV 95.7 04/20/2021 0500   MCH 33.5 04/20/2021 0500   MCHC 35.0 04/20/2021 0500   RDW 12.0 04/20/2021 0500   LYMPHSABS 3.2 04/20/2021 0500   MONOABS 1.1 (H) 04/20/2021 0500   EOSABS 0.1 04/20/2021 0500   BASOSABS 0.0 04/20/2021 0500    BMET    Component Value Date/Time   NA 137 04/20/2021 0500   K 3.7 04/20/2021 0500   CL 107 04/20/2021 0500   CO2 24 04/20/2021 0500   GLUCOSE 143 (H) 04/20/2021 0500   BUN 11 04/20/2021 0500   CREATININE 0.64 04/20/2021 0500   CALCIUM 7.9 (L) 04/20/2021 0500   GFRNONAA >60 04/20/2021 0500    INR    Component Value Date/Time   INR 1.0 04/15/2021 1252     Intake/Output Summary (Last 24 hours) at 04/20/2021 0746 Last data filed at 04/20/2021 0657 Gross per 24 hour  Intake 1600 ml  Output 1185 ml  Net 415 ml     Assessment/Plan:  51 y.o. male is s/p R CEA 1 Day Post-Op   No further neurological events overnight D/c arterial line OOB today 35cc from drain overnight; continue for now   Emilie Rutter, PA-C [facility] [phone] 04/20/2021 7:46 AM  VASCULAR STAFF ADDENDUM: I have independently interviewed and examined the patient. I agree with the above.  Drain pulled. No deficits. Feels well.  Home today from vascular perspective My office will set up follow  up   J. Gillis Santa, MD [facility] Phone Number: [phone] 04/20/2021 10:43 AM

## 2021-04-20 NOTE — Progress Notes (Signed)
Pt being d/c, VSS, IV removed, education complete, tele removed and returned.   Kalman Jewels, RN 04/20/2021

## 2021-05-05 ENCOUNTER — Other Ambulatory Visit: Payer: Self-pay | Admitting: *Deleted

## 2021-05-05 NOTE — Patient Outreach (Signed)
[facility]) Care Management  05/05/2021  Martin Lee 18-Feb-1971 [id number]   [facility] Unsuccessful outreach for EMMI stroke red alert for scheduled appointment concern   Outreach attempt to the listed at the preferred outreach number in EPIC  No answer. [facility] RN CM left HIPAA [facility] Portability and Accountability Act) compliant voicemail message along with CMs contact info.   Plan: [facility] RN CM scheduled this patient for another call attempt within 30+ business days Unsuccessful outreach letter sent on 05/05/21 Unsuccessful outreach on 05/05/21   [name], RN, BSN, CCM [facility] Telephonic Care Management Care Coordinator Office number [phone] Mobile number [phone]  Main [facility] number [phone] Fax number [phone]

## 2021-05-05 NOTE — Patient Outreach (Signed)
Received a red flag Emmi stroke notification for Mr. Lascelles.  I have assigned Joellyn Quails,  RN to call for follow up and determine if there are any Case Management needs.    [place], [place], [place] Management [phone]

## 2021-05-06 ENCOUNTER — Other Ambulatory Visit: Payer: Self-pay | Admitting: *Deleted

## 2021-05-06 ENCOUNTER — Other Ambulatory Visit: Payer: Self-pay

## 2021-05-06 ENCOUNTER — Encounter: Payer: Self-pay | Admitting: *Deleted

## 2021-05-06 NOTE — Patient Outreach (Signed)
[facility]) Care Management Telephonic RN Care Manager Note   05/06/2021 Name:  Martin Lee MRN:  [id number] DOB:  [birth year]  Summary: Successful outreach with resolution of EMMI red alert concern for appointment error with EMMI outreach He has all his appointments  Martin Lee was able to inform RN CM of the dates of all his appointments with his pcp, vascular surgeon and neurologist Denied needs with further assessment  Recommendations/Changes made from today's visit: Assessment for red alert and all other needs Reviewed discharge sheet with appointments and discussed importance of follow up visits Review pending EMMI unable to reach letter and let RN CM know if any further needs  Discussed that a reminder outreach will occur for his upcoming appointments Advised patient that there will be further automated EMMI- post discharge calls to assess how the patient is doing following the recent hospitalization Advised the patient that another call may be received from a nurse if any of their responses were abnormal. Patient voiced understanding and was appreciative of f/u call.  Subjective: Martin Lee is an 51 y.o. year old male who is a primary patient of Rebekah Chesterfield, NP. The care management team was consulted for assistance with care management and/or care coordination needs.    Telephonic RN Care Manager completed Telephone Visit today.   Objective:  Medications Reviewed Today     Reviewed by Clinton Gallant, RN (Registered Nurse) on 05/06/21 at 1058  Med List Status: <None>   Medication Order Taking? Sig Documenting Provider Last Dose Status Informant  ALPRAZolam (XANAX) 1 MG tablet [id number]  Take 1 mg by mouth 2 (two) times daily. [provider]  Active Spouse/Significant Other  aspirin 81 MG chewable tablet [id number]  Chew 1 tablet (81 mg total) by mouth daily. Glade Lloyd, MD  Active   atorvastatin (LIPITOR) 40 MG tablet [id number]  Take 40  mg by mouth every evening. [provider]  Active Spouse/Significant Other  clopidogrel (PLAVIX) 75 MG tablet [id number]  Take 1 tablet (75 mg total) by mouth daily. Glade Lloyd, MD  Active   empagliflozin (JARDIANCE) 25 MG TABS tablet [id number]  Take 25 mg by mouth daily. [provider]  Active Spouse/Significant Other  escitalopram (LEXAPRO) 10 MG tablet [id number]  Take 10 mg by mouth daily. [provider]  Active Spouse/Significant Other  metFORMIN (GLUCOPHAGE) 1000 MG tablet [id number]  Take 1,000 mg by mouth 2 (two) times daily with a meal. [provider]  Active Spouse/Significant Other           Patient Active Problem List   Diagnosis Date Noted   Vision loss of right eye 04/16/2021   CVA (cerebral vascular accident) (HCC) 04/16/2021   Leukocytosis 04/16/2021   Polycythemia 04/16/2021   Tobacco abuse 04/16/2021   Ulnar neuropathy 02/23/2020   Body mass index (BMI) 28.0-28.9, adult 02/02/2020   Cervical stenosis of spine 02/02/2020   Atypical chest pain 11/12/2019   Severe protein-calorie malnutrition (HCC) 08/28/2016   Hyperglycemia 08/25/2016   Macrocytosis 08/25/2016   Elevated troponin 08/23/2016   Ischemic cardiomyopathy 08/23/2016   Essential hypertension 08/22/2016   Small bowel obstruction (HCC) 08/22/2016   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   Uncontrolled type 2 diabetes mellitus with circulatory disorder, without long-term current use of insulin 08/12/2016   Noncompliance with diabetes treatment 08/12/2016   Obesity (BMI 30-39.9) 08/12/2016   CAD (coronary artery disease) 08/09/2016     SDOH:  (Social Determinants of Health) assessments and interventions performed:  SDOH Interventions    Flowsheet Row Most Recent Value  SDOH Interventions   Food Insecurity Interventions Intervention Not Indicated  Financial Strain Interventions Intervention Not Indicated  Housing Interventions Intervention Not Indicated  Intimate Partner  Violence Interventions Intervention Not Indicated  Stress Interventions Intervention Not Indicated  Transportation Interventions Intervention Not Indicated       Care Plan  Review of patient past medical history, allergies, medications, health status, including review of consultants reports, laboratory and other test data, was performed as part of comprehensive evaluation for care management services.   There are no care plans that you recently modified to display for this patient.    Plan: No further follow up required: EMMI red alert resolved no further needs identified  Martin Lee L. Noelle Penner, RN, BSN, CCM [facility] Telephonic Care Management Care Coordinator Office number [phone] [facility] number [phone] Fax number [phone]

## 2021-05-13 ENCOUNTER — Other Ambulatory Visit: Payer: Self-pay

## 2021-05-13 ENCOUNTER — Encounter: Payer: Self-pay | Admitting: Physician Assistant

## 2021-05-13 ENCOUNTER — Ambulatory Visit (INDEPENDENT_AMBULATORY_CARE_PROVIDER_SITE_OTHER): Payer: Commercial Managed Care - PPO | Admitting: Physician Assistant

## 2021-05-13 ENCOUNTER — Encounter: Payer: Self-pay | Admitting: Vascular Surgery

## 2021-05-13 VITALS — BP 118/75 | HR 94 | Temp 97.8°F | Resp 20 | Ht 69.0 in | Wt 189.3 lb

## 2021-05-13 DIAGNOSIS — I6521 Occlusion and stenosis of right carotid artery: Secondary | ICD-10-CM

## 2021-05-13 NOTE — Progress Notes (Signed)
°  POST OPERATIVE OFFICE NOTE    CC:  F/u for surgery  HPI:  This is a 51 y.o. 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  The CEA was performed by Dr. Virl Cagey on 04/19/21.  He is here for incisional check and exam.    Left ICA Is < 39% stenosis on duplex performed on 04/17/21.  Pt returns today for follow up.  Pt states he is adapting to the right eye vision loss and pain with turning his head.  He has a very physical job.  We will keep him out of work until the first of March.    No Known Allergies  Current Outpatient Medications  Medication Sig Dispense Refill   ALPRAZolam (XANAX) 1 MG tablet Take by mouth.     aspirin 81 MG EC tablet Take by mouth.     atorvastatin (LIPITOR) 40 MG tablet Take by mouth.     clopidogrel (PLAVIX) 75 MG tablet Take 1 tablet (75 mg total) by mouth daily. 30 tablet 0   empagliflozin (JARDIANCE) 25 MG TABS tablet Take 25 mg by mouth daily.     escitalopram (LEXAPRO) 10 MG tablet Take 10 mg by mouth daily.     metFORMIN (GLUCOPHAGE) 1000 MG tablet Take 1,000 mg by mouth 2 (two) times daily with a meal.     No current facility-administered medications for this visit.     ROS:  See HPI  Physical Exam:    Incision:  right neck incision healing well. Extremities:  palpable radial pulse equal, grip equal and sensation intact. Neuro: no tongue deviation and no facial droop    Assessment/Plan:  This is a 51 y.o. 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     He denies new symptoms of stroke/TIA.  Pt states he is adapting to the right eye vision loss and pain with turning his head.  He has a very physical job.  We will keep him out of work until the first of March.  He will f/u in 4-6 weeks for carotid duplex.  He is currently on ASA, Plavix and Statin daily for maximum medical management.    Roxy Horseman PA-C [facility] [phone]   Clinic MD:  Virl Cagey

## 2021-05-16 ENCOUNTER — Other Ambulatory Visit: Payer: Self-pay

## 2021-05-16 DIAGNOSIS — I6521 Occlusion and stenosis of right carotid artery: Secondary | ICD-10-CM

## 2021-05-19 ENCOUNTER — Other Ambulatory Visit: Payer: Self-pay | Admitting: *Deleted

## 2021-05-19 DIAGNOSIS — I6521 Occlusion and stenosis of right carotid artery: Secondary | ICD-10-CM

## 2021-06-08 DIAGNOSIS — F321 Major depressive disorder, single episode, moderate: Secondary | ICD-10-CM | POA: Insufficient documentation

## 2021-06-08 DIAGNOSIS — E109 Type 1 diabetes mellitus without complications: Secondary | ICD-10-CM | POA: Insufficient documentation

## 2021-06-08 DIAGNOSIS — G629 Polyneuropathy, unspecified: Secondary | ICD-10-CM | POA: Insufficient documentation

## 2021-06-08 DIAGNOSIS — E782 Mixed hyperlipidemia: Secondary | ICD-10-CM | POA: Insufficient documentation

## 2021-06-08 DIAGNOSIS — Z7984 Long term (current) use of oral hypoglycemic drugs: Secondary | ICD-10-CM | POA: Insufficient documentation

## 2021-06-08 DIAGNOSIS — F418 Other specified anxiety disorders: Secondary | ICD-10-CM | POA: Insufficient documentation

## 2021-06-08 DIAGNOSIS — M5430 Sciatica, unspecified side: Secondary | ICD-10-CM | POA: Insufficient documentation

## 2021-06-08 DIAGNOSIS — G4700 Insomnia, unspecified: Secondary | ICD-10-CM | POA: Insufficient documentation

## 2021-06-08 DIAGNOSIS — L299 Pruritus, unspecified: Secondary | ICD-10-CM | POA: Insufficient documentation

## 2021-06-08 DIAGNOSIS — Z951 Presence of aortocoronary bypass graft: Secondary | ICD-10-CM | POA: Insufficient documentation

## 2021-06-08 NOTE — Progress Notes (Deleted)
?  POST OPERATIVE OFFICE NOTE ? ? ? ?CC:  F/u for surgery ? ?HPI:  This is a 51 y.o. male who is s/p right carotid endarterectomy by Dr. Karin Lieu on 04/19/21. This was performed secondary to symptomatic right internal carotid artery stenosis. He had amaurosis with right retinal artery occlusion. He was seen post operatively on 05/13/21 and was doing well at that time. He was adjusting to his right visual deficits and having some mild incisional pain.  ? ?Today he returns for follow up with non invasive studies. He reports *** ? ?No Known Allergies ? ?Current Outpatient Medications  ?Medication Sig Dispense Refill  ? ALPRAZolam (XANAX) 1 MG tablet Take by mouth.    ? aspirin 81 MG EC tablet Take by mouth.    ? atorvastatin (LIPITOR) 40 MG tablet Take by mouth.    ? clopidogrel (PLAVIX) 75 MG tablet Take 1 tablet (75 mg total) by mouth daily. 30 tablet 0  ? empagliflozin (JARDIANCE) 25 MG TABS tablet Take 25 mg by mouth daily.    ? escitalopram (LEXAPRO) 10 MG tablet Take 10 mg by mouth daily.    ? metFORMIN (GLUCOPHAGE) 1000 MG tablet Take 1,000 mg by mouth 2 (two) times daily with a meal.    ? ?No current facility-administered medications for this visit.  ? ? ? ROS:  See HPI ? ?Physical Exam: ? ?There were no vitals filed for this visit. ? ?Incision:  *** ?Extremities:  *** ?Neuro: *** ?Abdomen:  *** ? ?Assessment/Plan:  This is a 51 y.o. male who is s/p: right carotid endarterectomy by Dr. Karin Lieu on 04/19/21. This was performed secondary to symptomatic right internal carotid artery stenosis ?*** ? ?-*** ? ? ?Graceann Congress, PA-C ?[facility] ?[phone] ? ?Clinic MD:  Dr. Karin Lieu ?

## 2021-06-10 ENCOUNTER — Encounter (HOSPITAL_COMMUNITY): Payer: Commercial Managed Care - PPO

## 2021-06-13 ENCOUNTER — Inpatient Hospital Stay: Payer: Commercial Managed Care - PPO | Admitting: Adult Health

## 2021-06-13 ENCOUNTER — Encounter: Payer: Self-pay | Admitting: Adult Health

## 2021-06-13 NOTE — Progress Notes (Deleted)
?[facility] ?[address] ?[place] [address] ?[phone] ? ?     HOSPITAL FOLLOW UP NOTE ? ?Mr. Martin Lee ?Date of Birth:  1970-09-07 ?Medical Record Number:  [id number]  ? ?Reason for Referral:  hospital stroke follow up ? ? ? ?SUBJECTIVE: ? ? ?CHIEF COMPLAINT:  ?No chief complaint on file. ? ? ?HPI:  ? ?Martin Lee is a 51 year old male with history of CAD status post CABG, diabetes, smoker presented on 1/7/20223 with right eye painless vision loss.  personally reviewed hospitalization pertinent progress notes, lab work and imaging. Evaluated by Dr. Erlinda Hong.  CT no acute abnormality.  MRI showed right frontal 5-6 punctate infarcts, old left occipital and left caudate head lacunar infarct.  CTA head and neck showed bilateral ICA calcified and soft plaque with right ICA 50% stenosis, bilateral V4 and ICA siphon atherosclerosis.  Overall intracranial and extracranial atherosclerosis out of proportion to his age.  EF 50 to 55%, LDL 76.5, A1c 7.4, UDS positive for THC.  Creatinine 0.94, ESR and CRP negative. On exam,  ? ? ? ? ? ? ? ?PERTINENT IMAGING ? ? ? ? ? ?ROS:   ?14 system review of systems performed and negative with exception of *** ? ?PMH:  ?Past Medical History:  ?Diagnosis Date  ? Coronary artery disease   ? Diabetes mellitus without complication ([place])   ? ? ?PSH:  ?Past Surgical History:  ?Procedure Laterality Date  ? CORONARY ARTERY BYPASS GRAFT  2018  ? ENDARTERECTOMY Right 04/19/2021  ? Procedure: RIGHT CAROTID ENDARTERECTOMY;  Surgeon: Broadus John, MD;  Location: [place];  Service: Vascular;  Laterality: Right;  ? ? ?Social History:  ?Social History  ? ?Socioeconomic History  ? Marital status: Married  ?  Spouse name: Martin Lee  ? Number of children: Not on file  ? Years of education: Not on file  ? Highest education level: Not on file  ?Occupational History  ? Not on file  ?Tobacco Use  ? Smoking status: Every Day  ?  Types: Cigarettes  ? Smokeless tobacco: Never  ?Vaping Use   ? Vaping Use: Never used  ?Substance and Sexual Activity  ? Alcohol use: Yes  ? Drug use: Not Currently  ? Sexual activity: Not on file  ?Other Topics Concern  ? Not on file  ?Social History Narrative  ? Not on file  ? ?Social Determinants of Health  ? ?Financial Resource Strain: Low Risk   ? Difficulty of Paying Living Expenses: Not hard at all  ?Food Insecurity: No Food Insecurity  ? Worried About Charity fundraiser in the Last Year: Never true  ? Ran Out of Food in the Last Year: Never true  ?Transportation Needs: No Transportation Needs  ? Lack of Transportation (Medical): No  ? Lack of Transportation (Non-Medical): No  ?Physical Activity: Not on file  ?Stress: No Stress Concern Present  ? Feeling of Stress : Not at all  ?Social Connections: Not on file  ?Intimate Partner Violence: Not At Risk  ? Fear of Current or Ex-Partner: No  ? Emotionally Abused: No  ? Physically Abused: No  ? Sexually Abused: No  ? ? ?Family History: No family history on file. ? ?Medications:   ?Current Outpatient Medications on File Prior to Visit  ?Medication Sig Dispense Refill  ? ALPRAZolam (XANAX) 1 MG tablet Take by mouth.    ? aspirin 81 MG EC tablet Take by mouth.    ? atorvastatin (LIPITOR) 40 MG tablet Take  by mouth.    ? clopidogrel (PLAVIX) 75 MG tablet Take 1 tablet (75 mg total) by mouth daily. 30 tablet 0  ? empagliflozin (JARDIANCE) 25 MG TABS tablet Take 25 mg by mouth daily.    ? escitalopram (LEXAPRO) 10 MG tablet Take 10 mg by mouth daily.    ? metFORMIN (GLUCOPHAGE) 1000 MG tablet Take 1,000 mg by mouth 2 (two) times daily with a meal.    ? ?No current facility-administered medications on file prior to visit.  ? ? ?Allergies:  No Known Allergies ? ? ? ?OBJECTIVE: ? ?Physical Exam ? ?There were no vitals filed for this visit. ?There is no height or weight on file to calculate BMI. ?No results found. ? ?Depression screen [facility] 2/9 05/06/2021  ?Decreased Interest 0  ?Down, Depressed, Hopeless 0  ?PHQ - 2 Score 0  ?   ? ?General: well developed, well nourished, seated, in no evident distress ?Head: head normocephalic and atraumatic.   ?Neck: supple with no carotid or supraclavicular bruits ?Cardiovascular: regular rate and rhythm, no murmurs ?Musculoskeletal: no deformity ?Skin:  no rash/petichiae ?Vascular:  Normal pulses all extremities ?  ?Neurologic Exam ?Mental Status: Awake and fully alert. Oriented to place and time. Recent and remote memory intact. Attention span, concentration and fund of knowledge appropriate. Mood and affect appropriate.  ?Cranial Nerves: Fundoscopic exam reveals sharp disc margins. Pupils equal, briskly reactive to light. Extraocular movements full without nystagmus. Visual fields full to confrontation. Hearing intact. Facial sensation intact. Face, tongue, palate moves normally and symmetrically.  ?Motor: Normal bulk and tone. Normal strength in all tested extremity muscles ?Sensory.: intact to touch , pinprick , position and vibratory sensation.  ?Coordination: Rapid alternating movements normal in all extremities. Finger-to-nose and heel-to-shin performed accurately bilaterally. ?Gait and Station: Arises from chair without difficulty. Stance is normal. Gait demonstrates normal stride length and balance with ***. Tandem walk and heel toe ***.  ?Reflexes: 1+ and symmetric. Toes downgoing.  ? ? ? ?NIHSS  *** ?Modified Rankin  *** ? ? ? ? ? ?ASSESSMENT: Martin Lee is a 51 y.o. year old male ***. Vascular risk factors include ***.  ? ? ? ? ?PLAN: ? ?*** : Residual deficit: ***. Continue {anticoagulants:31417}  and ***  for secondary stroke prevention.  Discussed secondary stroke prevention measures and importance of close PCP follow up for aggressive stroke risk factor management. I have gone over the pathophysiology of stroke, warning signs and symptoms, risk factors and their management in some detail with instructions to go to the closest emergency room for symptoms of concern. ?HTN: BP goal  <130/90.  Stable on *** per PCP ?HLD: LDL goal <70. Recent LDL ***.  ?DMII: A1c goal<7.0. Recent A1c ***.  ? ? ? ?Follow up in *** or call earlier if needed ? ? ?CC:  ?[facility] provider: Dr. Leonie Man ?PCP: Adaline Sill, NP   ? ?I spent *** minutes of face-to-face and non-face-to-face time with patient.  This included previsit chart review including review of recent hospitalization, lab review, study review, order entry, electronic health record documentation, patient education regarding recent stroke including etiology, secondary stroke prevention measures and importance of managing stroke risk factors, residual deficits and typical recovery time and answered all other questions to patient satisfaction ? ? ?Frann Rider, AGNP-BC ? ?[facility] ?[place], [place] [address] ? ?Phone [phone] Fax [phone] ?Note: This document was prepared with digital dictation and possible smart phrase technology. Any transcriptional errors that result  from this process are unintentional. ? ? ? ? ? ?

## 2021-06-24 ENCOUNTER — Encounter: Payer: Self-pay | Admitting: Vascular Surgery

## 2021-06-24 ENCOUNTER — Other Ambulatory Visit: Payer: Self-pay

## 2021-06-24 ENCOUNTER — Ambulatory Visit: Payer: Commercial Managed Care - PPO | Admitting: Vascular Surgery

## 2021-06-24 ENCOUNTER — Ambulatory Visit (HOSPITAL_COMMUNITY)
Admission: RE | Admit: 2021-06-24 | Discharge: 2021-06-24 | Disposition: A | Discharge status: Home / Self Care | Payer: Commercial Managed Care - PPO | Source: Ambulatory Visit | Attending: Vascular Surgery | Admitting: Vascular Surgery

## 2021-06-24 VITALS — BP 136/87 | HR 76 | Temp 98.1°F | Resp 20 | Ht 69.0 in | Wt 187.0 lb

## 2021-06-24 DIAGNOSIS — I6521 Occlusion and stenosis of right carotid artery: Secondary | ICD-10-CM | POA: Diagnosis present

## 2021-06-24 DIAGNOSIS — Z9889 Other specified postprocedural states: Secondary | ICD-10-CM

## 2021-06-24 NOTE — Progress Notes (Signed)
?  Progress Note ? ?S/p: 04/19/2021 right carotid endarterectomy for symptomatic internal carotid artery stenosis ? ?Subjective: ?Doing well on exam today, no complaints, denies symptoms of TIA, stroke, amaurosis.  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 ? ?Objective: ?Vitals:  ? 06/24/21 1514  ?BP: 136/87  ?Pulse: 76  ?Resp: 20  ?Temp: 98.1 ?F (36.7 ?C)  ?SpO2: 95%  ?  ?Physical Examination ?Incision well-healed ?Sensorimotor intact ?Symmetric motor and sensory exam ? ?ASSESSMENT/PLAN:  ?Martin Lee is a 51 year old male status post 04/19/2021 right carotid endarterectomy for symptomatic internal carotid artery stenosis.  He is doing well postoperatively.  His wounds have healed appropriately.  He has had no further episodes.  Ultrasound was reviewed demonstrating widely patent right common carotid, carotid bulb, internal carotid artery. ? ?Then he asked to follow-up at any point hospital, as it is closer to his home.  I will set him up with my colleague, Dr. Tawanna Cooler Early for follow-up in 6 months with repeat carotid duplex ultrasound. ?The study be normal, he will have yearly follow-up afterwards. ?He was asked to continue his current medication regimen including DAPT, intensity statin ?Martin Lee is aware smoking will cause restenosis, he was not just in smoking cessation at this time. ? ? ?J. Gillis Santa MD MS ?[facility] ?[phone] ?06/24/2021  ?5:46 PM ? ?

## 2021-07-01 ENCOUNTER — Other Ambulatory Visit: Payer: Self-pay | Admitting: *Deleted

## 2021-07-01 DIAGNOSIS — Z9889 Other specified postprocedural states: Secondary | ICD-10-CM

## 2021-07-01 DIAGNOSIS — I6521 Occlusion and stenosis of right carotid artery: Secondary | ICD-10-CM

## 2022-09-15 ENCOUNTER — Ambulatory Visit: Payer: Commercial Managed Care - PPO | Admitting: Cardiology

## 2022-09-15 NOTE — Progress Notes (Deleted)
Clinical Summary Martin Lee is a 52 y.o.male   Previously followed [facility] cardiology Dr Alonza Bogus   1.CAD - history of CABG May 2018 LIMA to the LAD, SVG to OM1, SVG to OM 2, and SVG to distal RCA      2. 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 HTN   4. HLD  5.PAF -    Past Medical History:  Diagnosis Date   Anxiety    Chronic pruritus    Coronary artery disease    Diabetes mellitus without complication ([facility])      No Known Allergies   Current Outpatient Medications  Medication Sig Dispense Refill   ALPRAZolam (XANAX) 1 MG tablet Take by mouth.     aspirin 81 MG EC tablet Take by mouth.     atorvastatin (LIPITOR) 40 MG tablet Take by mouth.     clopidogrel (PLAVIX) 75 MG tablet Take 1 tablet (75 mg total) by mouth daily. 30 tablet 0   empagliflozin (JARDIANCE) 25 MG TABS tablet Take 25 mg by mouth daily.     escitalopram (LEXAPRO) 10 MG tablet Take 10 mg by mouth daily.     metFORMIN (GLUCOPHAGE) 1000 MG tablet Take 1,000 mg by mouth 2 (two) times daily with a meal.     No current facility-administered medications for this visit.     Past Surgical History:  Procedure Laterality Date   CORONARY ARTERY BYPASS GRAFT  2018   ENDARTERECTOMY Right 04/19/2021   Procedure: RIGHT CAROTID ENDARTERECTOMY;  Surgeon: Victorino Sparrow, MD;  Location: [facility];  Service: Vascular;  Laterality: Right;     No Known Allergies    Family History  Problem Relation Age of Onset   Hypertension Mother    Diabetes Mellitus I Mother    Heart disease Father    Other Father        Myocardial infarction     Social History Martin Lee reports that he has been smoking cigarettes. He has been smoking an average of .5 packs per day. He has never used smokeless tobacco. Martin Lee reports current alcohol use.   Review of Systems CONSTITUTIONAL: No weight loss, fever, chills, weakness or fatigue.   HEENT: Eyes: No visual loss, blurred vision, double vision or yellow sclerae.No hearing loss, sneezing, congestion, runny nose or sore throat.  SKIN: No rash or itching.  CARDIOVASCULAR:  RESPIRATORY: No shortness of breath, cough or sputum.  GASTROINTESTINAL: No anorexia, nausea, vomiting or diarrhea. No abdominal pain or blood.  GENITOURINARY: No burning on urination, no polyuria NEUROLOGICAL: No headache, dizziness, syncope, paralysis, ataxia, numbness or tingling in the extremities. No change in bowel or bladder control.  MUSCULOSKELETAL: No muscle, back pain, joint pain or stiffness.  LYMPHATICS: No enlarged nodes. No history of splenectomy.  PSYCHIATRIC: No history of depression or anxiety.  ENDOCRINOLOGIC: No reports of sweating, cold or heat intolerance. No polyuria or polydipsia.  Marland Kitchen   Physical Examination There were no vitals filed for this visit. There were no vitals filed for this visit.  Gen: resting comfortably, no acute distress HEENT: no scleral icterus, pupils equal round and reactive, no palptable cervical adenopathy,  CV Resp: Clear to auscultation bilaterally GI: abdomen is soft, non-tender, non-distended, normal bowel sounds, no hepatosplenomegaly MSK: extremities are warm, no edema.  Skin: warm, no rash Neuro:  no focal deficits Psych: appropriate affect   Diagnostic Studies  [facility] records  Cardiac echo from Aug 09, 2016-moderately reduced LVEF 30 to 35%, severe diffuse hypokinesis, grade 2 diastolic dysfunction, no significant valvular disease.  Cardiac Cath from Aug 08, 2016 1. Left Main - Normal 2. Left anterior descending artery - 99% proximal ruptured plaque; the distal LAD is small and diffusely diseased 3. Diagonals - chronic total occlusion of a small diagonal that fills via left to left collaterals 4. Left Circumflex - 10% proximal, 75% mid prior to the bifurcation of obtuse marginal one 5. Obtuse Marginals - large obtuse marginal one without  disease, 75% proximal large obtuse marginal 2 6. Right Coronary Artery - 100% chronic total occlusion with left to right collaterals  Cardiac echo from Aug 25, 2016 with normal LVEF 55 to 60%, grade 1 diastolic dysfunction, no significant valvular disease.    Assessment and Plan        Antoine Poche, M.D., F.A.C.C.

## 2022-10-18 ENCOUNTER — Ambulatory Visit: Payer: Self-pay | Admitting: Internal Medicine

## 2022-12-07 ENCOUNTER — Ambulatory Visit: Payer: Self-pay | Admitting: Internal Medicine

## 2023-01-11 ENCOUNTER — Encounter: Payer: Self-pay | Admitting: Internal Medicine

## 2023-01-11 NOTE — Progress Notes (Signed)
Erroneous encounter - please disregard.
